# Patient Record
Sex: Female | Born: 1990 | Race: White | Hispanic: No | Marital: Single | Smoking: Never smoker
Health system: Southern US, Community
[De-identification: ages and names within clinical notes are randomized; demographics above are authoritative.]

## PROBLEM LIST (undated history)

## (undated) DIAGNOSIS — F909 Attention-deficit hyperactivity disorder, unspecified type: Secondary | ICD-10-CM

## (undated) DIAGNOSIS — F32A Depression, unspecified: Secondary | ICD-10-CM

## (undated) DIAGNOSIS — F419 Anxiety disorder, unspecified: Secondary | ICD-10-CM

## (undated) DIAGNOSIS — R109 Unspecified abdominal pain: Secondary | ICD-10-CM

## (undated) DIAGNOSIS — N390 Urinary tract infection, site not specified: Secondary | ICD-10-CM

## (undated) DIAGNOSIS — F319 Bipolar disorder, unspecified: Secondary | ICD-10-CM

## (undated) DIAGNOSIS — F99 Mental disorder, not otherwise specified: Secondary | ICD-10-CM

## (undated) DIAGNOSIS — G43909 Migraine, unspecified, not intractable, without status migrainosus: Secondary | ICD-10-CM

## (undated) HISTORY — DX: Anxiety disorder, unspecified: F41.9

## (undated) HISTORY — DX: Urinary tract infection, site not specified: N39.0

## (undated) HISTORY — DX: Unspecified abdominal pain: R10.9

## (undated) HISTORY — DX: Depression, unspecified: F32.A

## (undated) HISTORY — DX: Bipolar disorder, unspecified: F31.9

## (undated) HISTORY — PX: TONSILLECTOMY: SUR1361

## (undated) HISTORY — DX: Mental disorder, not otherwise specified: F99

## (undated) HISTORY — DX: Attention-deficit hyperactivity disorder, unspecified type: F90.9

## (undated) HISTORY — DX: Migraine, unspecified, not intractable, without status migrainosus: G43.909

---
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
## 2018-02-13 DIAGNOSIS — F3170 Bipolar disorder, currently in remission, most recent episode unspecified: Secondary | ICD-10-CM | POA: Insufficient documentation

## 2018-02-13 DIAGNOSIS — F419 Anxiety disorder, unspecified: Secondary | ICD-10-CM | POA: Insufficient documentation

## 2018-02-13 DIAGNOSIS — F411 Generalized anxiety disorder: Secondary | ICD-10-CM | POA: Insufficient documentation

## 2019-01-21 ENCOUNTER — Encounter: Payer: Self-pay | Admitting: Radiology

## 2019-01-24 ENCOUNTER — Encounter: Payer: Self-pay | Admitting: Obstetrics and Gynecology

## 2019-01-29 ENCOUNTER — Other Ambulatory Visit (HOSPITAL_COMMUNITY)
Admission: RE | Admit: 2019-01-29 | Discharge: 2019-01-29 | Disposition: A | Discharge status: Home / Self Care | Payer: BLUE CROSS/BLUE SHIELD | Source: Ambulatory Visit | Attending: Obstetrics and Gynecology | Admitting: Obstetrics and Gynecology

## 2019-01-29 ENCOUNTER — Ambulatory Visit (INDEPENDENT_AMBULATORY_CARE_PROVIDER_SITE_OTHER): Payer: BLUE CROSS/BLUE SHIELD | Admitting: Obstetrics & Gynecology

## 2019-01-29 ENCOUNTER — Encounter: Payer: Self-pay | Admitting: Obstetrics & Gynecology

## 2019-01-29 VITALS — BP 114/81 | HR 80 | Ht 61.0 in | Wt 181.0 lb

## 2019-01-29 DIAGNOSIS — Z01419 Encounter for gynecological examination (general) (routine) without abnormal findings: Secondary | ICD-10-CM | POA: Insufficient documentation

## 2019-01-29 DIAGNOSIS — Z3202 Encounter for pregnancy test, result negative: Secondary | ICD-10-CM | POA: Diagnosis not present

## 2019-01-29 DIAGNOSIS — Z30431 Encounter for routine checking of intrauterine contraceptive device: Secondary | ICD-10-CM

## 2019-01-29 LAB — POCT URINE PREGNANCY: Preg Test, Ur: NEGATIVE

## 2019-01-29 NOTE — Patient Instructions (Signed)
Preventive Care 18-39 Years, Female Preventive care refers to lifestyle choices and visits with your health care provider that can promote health and wellness. What does preventive care include?   A yearly physical exam. This is also called an annual well check.  Dental exams once or twice a year.  Routine eye exams. Ask your health care provider how often you should have your eyes checked.  Personal lifestyle choices, including: ? Daily care of your teeth and gums. ? Regular physical activity. ? Eating a healthy diet. ? Avoiding tobacco and drug use. ? Limiting alcohol use. ? Practicing safe sex. ? Taking vitamin and mineral supplements as recommended by your health care provider. What happens during an annual well check? The services and screenings done by your health care provider during your annual well check will depend on your age, overall health, lifestyle risk factors, and family history of disease. Counseling Your health care provider may ask you questions about your:  Alcohol use.  Tobacco use.  Drug use.  Emotional well-being.  Home and relationship well-being.  Sexual activity.  Eating habits.  Work and work environment.  Method of birth control.  Menstrual cycle.  Pregnancy history. Screening You may have the following tests or measurements:  Height, weight, and BMI.  Diabetes screening. This is done by checking your blood sugar (glucose) after you have not eaten for a while (fasting).  Blood pressure.  Lipid and cholesterol levels. These may be checked every 5 years starting at age 20.  Skin check.  Hepatitis C blood test.  Hepatitis B blood test.  Sexually transmitted disease (STD) testing.  BRCA-related cancer screening. This may be done if you have a family history of breast, ovarian, tubal, or peritoneal cancers.  Pelvic exam and Pap test. This may be done every 3 years starting at age 21. Starting at age 30, this may be done every 5  years if you have a Pap test in combination with an HPV test. Discuss your test results, treatment options, and if necessary, the need for more tests with your health care provider. Vaccines Your health care provider may recommend certain vaccines, such as:  Influenza vaccine. This is recommended every year.  Tetanus, diphtheria, and acellular pertussis (Tdap, Td) vaccine. You may need a Td booster every 10 years.  Varicella vaccine. You may need this if you have not been vaccinated.  HPV vaccine. If you are 26 or younger, you may need three doses over 6 months.  Measles, mumps, and rubella (MMR) vaccine. You may need at least one dose of MMR. You may also need a second dose.  Pneumococcal 13-valent conjugate (PCV13) vaccine. You may need this if you have certain conditions and were not previously vaccinated.  Pneumococcal polysaccharide (PPSV23) vaccine. You may need one or two doses if you smoke cigarettes or if you have certain conditions.  Meningococcal vaccine. One dose is recommended if you are age 19-21 years and a first-year college student living in a residence hall, or if you have one of several medical conditions. You may also need additional booster doses.  Hepatitis A vaccine. You may need this if you have certain conditions or if you travel or work in places where you may be exposed to hepatitis A.  Hepatitis B vaccine. You may need this if you have certain conditions or if you travel or work in places where you may be exposed to hepatitis B.  Haemophilus influenzae type b (Hib) vaccine. You may need this if you   have certain risk factors. Talk to your health care provider about which screenings and vaccines you need and how often you need them. This information is not intended to replace advice given to you by your health care provider. Make sure you discuss any questions you have with your health care provider. Document Released: 01/10/2002 Document Revised: 06/27/2017  Document Reviewed: 09/15/2015 Elsevier Interactive Patient Education  2019 [employer].

## 2019-01-29 NOTE — Progress Notes (Signed)
GYNECOLOGY ANNUAL PREVENTATIVE CARE ENCOUNTER NOTE  History:     Veronica Burgess is a 28 y.o. [id number] female here to establish care and for a routine annual gynecologic exam.  Current complaints: none.  On Topamax, wants to discuss interaction with Mirena.   Denies abnormal vaginal bleeding, discharge, pelvic pain, problems with intercourse or other gynecologic concerns.    Gynecologic History No LMP recorded. (Menstrual status: IUD). Contraception: Mirena IUD placed 02/01/2017 Last Pap: 2018. Results were: normal   Obstetric History OB History  Gravida Para Term Preterm AB Living  1 1 1     1   SAB TAB Ectopic Multiple Live Births          1    # Outcome Date GA Lbr Len/2nd Weight Sex Delivery Anes PTL Lv  1 Term 10/28/13     Vag-Spont EPI  LIV    Past Medical History:  Diagnosis Date  . Mental disorder    Bipolar    Past Surgical History:  Procedure Laterality Date  . TONSILLECTOMY  2005    Current Outpatient Medications on File Prior to Visit  Medication Sig Dispense Refill  . ARIPiprazole (ABILIFY) 2 MG tablet Take 1/2 tablet daily for one week then take 1 tablet daily    . buPROPion (WELLBUTRIN XL) 150 MG 24 hr tablet Take 150 mg by mouth daily.    Marland Kitchen buPROPion (WELLBUTRIN XL) 300 MG 24 hr tablet Take 300 mg by mouth daily.    Marland Kitchen levonorgestrel (MIRENA) 20 MCG/24HR IUD 1 each by Intrauterine route once.    . topiramate (TOPAMAX) 100 MG tablet Take 75 mg by mouth 2 (two) times daily.    . clonazePAM (KLONOPIN) 0.5 MG tablet Take 0.5 mg by mouth 2 (two) times daily as needed.    Marland Kitchen LATUDA 20 MG TABS tablet Take 20 mg by mouth at bedtime.     No current facility-administered medications on file prior to visit.     No Known Allergies  Social History:  reports that she has never smoked. She has never used smokeless tobacco. She reports current alcohol use. She reports previous drug use.  History reviewed. No pertinent family history.  The following portions of the  patient's history were reviewed and updated as appropriate: allergies, current medications, past family history, past medical history, past social history, past surgical history and problem list.  Review of Systems Pertinent items noted in HPI and remainder of comprehensive ROS otherwise negative.  Physical Exam:  BP 114/81   Pulse 80   Ht 5\' 1"  (1.549 m)   Wt 181 lb (82.1 kg)   BMI 34.20 kg/m  CONSTITUTIONAL: Well-developed, well-nourished female in no acute distress.  HENT:  Normocephalic, atraumatic, External right and left ear normal. Oropharynx is clear and moist EYES: Conjunctivae and EOM are normal. Pupils are equal, round, and reactive to light. No scleral icterus.  NECK: Normal range of motion, supple, no masses.  Normal thyroid.  SKIN: Skin is warm and dry. No rash noted. Not diaphoretic. No erythema. No pallor. MUSCULOSKELETAL: Normal range of motion. No tenderness.  No cyanosis, clubbing, or edema.  2+ distal pulses. NEUROLOGIC: Alert and oriented to person, place, and time. Normal reflexes, muscle tone coordination. No cranial nerve deficit noted. PSYCHIATRIC: Normal mood and affect. Normal behavior. Normal judgment and thought content. CARDIOVASCULAR: Normal heart rate noted, regular rhythm RESPIRATORY: Clear to auscultation bilaterally. Effort and breath sounds normal, no problems with respiration noted. BREASTS: Symmetric in size. No masses, skin  changes, nipple drainage, or lymphadenopathy. ABDOMEN: Soft, normal bowel sounds, no distention noted.  No tenderness, rebound or guarding.  PELVIC: Normal appearing external genitalia; normal appearing vaginal mucosa and cervix. IUD strings visualized.  No abnormal discharge noted.  Pap smear obtained.  Normal uterine size, no other palpable masses, no uterine or adnexal tenderness.   Assessment and Plan:    1. IUD check up Talked about the effect of Topamax and conern about decreased progestin efficacy. Reiterated that IUD has  other mechanisms of action (creating hostile uterine environment, cervical mucus etc); and it is still the most effective contraception modality no matter the dose of Topamax.  Reiterated that no contraception is 100% effective though but IUDs are >99% effective.  Discussed switching to Paragard, she does not want this as the Mirena is also being used for its menstrual suppression effect as patient had heavy and painful periods in the past.  - POCT urine pregnancy  2. Well woman exam with routine gynecological exam - Cytology - PAP Will follow up results of pap smear and manage accordingly. Routine preventative health maintenance measures emphasized. Please refer to After Visit Summary for other counseling recommendations.      Jaynie Collins, MD, FACOG Obstetrician & Gynecologist, [facility] for [employer], [facility]

## 2019-01-30 LAB — CYTOLOGY - PAP: Diagnosis: NEGATIVE

## 2019-07-10 ENCOUNTER — Other Ambulatory Visit: Payer: Self-pay

## 2019-07-10 DIAGNOSIS — Z20822 Contact with and (suspected) exposure to covid-19: Secondary | ICD-10-CM

## 2019-07-11 LAB — NOVEL CORONAVIRUS, NAA: SARS-CoV-2, NAA: NOT DETECTED

## 2019-08-26 ENCOUNTER — Other Ambulatory Visit: Payer: Self-pay | Admitting: *Deleted

## 2019-08-26 DIAGNOSIS — Z20822 Contact with and (suspected) exposure to covid-19: Secondary | ICD-10-CM

## 2019-08-27 LAB — NOVEL CORONAVIRUS, NAA: SARS-CoV-2, NAA: NOT DETECTED

## 2019-09-23 ENCOUNTER — Other Ambulatory Visit: Payer: Self-pay

## 2019-09-23 DIAGNOSIS — Z20822 Contact with and (suspected) exposure to covid-19: Secondary | ICD-10-CM

## 2019-09-24 LAB — NOVEL CORONAVIRUS, NAA: SARS-CoV-2, NAA: NOT DETECTED

## 2019-10-01 ENCOUNTER — Other Ambulatory Visit: Payer: Self-pay

## 2019-10-01 DIAGNOSIS — Z20822 Contact with and (suspected) exposure to covid-19: Secondary | ICD-10-CM

## 2019-10-02 LAB — NOVEL CORONAVIRUS, NAA: SARS-CoV-2, NAA: NOT DETECTED

## 2019-12-19 ENCOUNTER — Encounter: Payer: Self-pay | Admitting: Radiology

## 2019-12-24 ENCOUNTER — Ambulatory Visit: Payer: Medicaid Other | Attending: Internal Medicine

## 2019-12-24 DIAGNOSIS — Z20822 Contact with and (suspected) exposure to covid-19: Secondary | ICD-10-CM

## 2019-12-25 LAB — NOVEL CORONAVIRUS, NAA: SARS-CoV-2, NAA: NOT DETECTED

## 2020-01-21 ENCOUNTER — Ambulatory Visit: Payer: BLUE CROSS/BLUE SHIELD | Admitting: Internal Medicine

## 2020-01-21 ENCOUNTER — Ambulatory Visit: Payer: Medicaid Other | Admitting: Primary Care

## 2020-01-28 ENCOUNTER — Ambulatory Visit (HOSPITAL_COMMUNITY)
Admission: EM | Admit: 2020-01-28 | Discharge: 2020-01-28 | Disposition: A | Discharge status: Home / Self Care | Payer: 59 | Attending: Family Medicine | Admitting: Family Medicine

## 2020-01-28 ENCOUNTER — Other Ambulatory Visit: Payer: Medicaid Other

## 2020-01-28 ENCOUNTER — Encounter (INDEPENDENT_AMBULATORY_CARE_PROVIDER_SITE_OTHER): Payer: Self-pay

## 2020-01-28 ENCOUNTER — Other Ambulatory Visit: Payer: Self-pay

## 2020-01-28 ENCOUNTER — Encounter (HOSPITAL_COMMUNITY): Payer: Self-pay

## 2020-01-28 DIAGNOSIS — Z79899 Other long term (current) drug therapy: Secondary | ICD-10-CM | POA: Diagnosis not present

## 2020-01-28 DIAGNOSIS — Z20822 Contact with and (suspected) exposure to covid-19: Secondary | ICD-10-CM | POA: Insufficient documentation

## 2020-01-28 DIAGNOSIS — F319 Bipolar disorder, unspecified: Secondary | ICD-10-CM | POA: Diagnosis not present

## 2020-01-28 DIAGNOSIS — R5383 Other fatigue: Secondary | ICD-10-CM | POA: Insufficient documentation

## 2020-01-28 LAB — TSH: TSH: 2.273 u[IU]/mL (ref 0.350–4.500)

## 2020-01-28 LAB — CBC
HCT: 41.8 % (ref 36.0–46.0)
Hemoglobin: 14.1 g/dL (ref 12.0–15.0)
MCH: 30.1 pg (ref 26.0–34.0)
MCHC: 33.7 g/dL (ref 30.0–36.0)
MCV: 89.3 fL (ref 80.0–100.0)
Platelets: 233 10*3/uL (ref 150–400)
RBC: 4.68 MIL/uL (ref 3.87–5.11)
RDW: 12.7 % (ref 11.5–15.5)
WBC: 6.4 10*3/uL (ref 4.0–10.5)
nRBC: 0 % (ref 0.0–0.2)

## 2020-01-28 NOTE — ED Triage Notes (Signed)
Pt presents with fatigue and generalized body aches X 2 days.

## 2020-01-28 NOTE — ED Provider Notes (Signed)
[facility]    CSN: [id number] Arrival date & time: 01/28/20  1228      History   Chief Complaint Chief Complaint  Patient presents with  . Appointment  . (12:30 pm Fatigue & Generalized Body Aches)    HPI Veronica Burgess is a 29 y.o. female.   HPI  Patient states that she has had fatigue for about a week.  She has had some generalized body aches for the last 2 days.  She states that she thought the fatigue might be just because she was working, single mother, and in school.  She does have an appointment with her primary care doctor for next week.  She works in a daycare.  She is here because with the body aches she feels like she needs Covid testing.  No sweats chills or fever.  No runny stuffy nose, sore throat, coughing or chest congestion.  No shortness of breath.  No nausea or vomiting, no change in taste or smell.  No known Covid exposure.  She states her throat feels dry. Patient states she had mono many years ago.  This feels similar.  She wonders whether mono can be chronic or recurring.  I told her it would be unusual for mono to come back after a many year absence. I discussed with patient that fatigue can be from stress as she was describing, could also be thyroid, anemia, or acute illness such as a virus.  Past Medical History:  Diagnosis Date  . Mental disorder    Bipolar    There are no problems to display for this patient.   Past Surgical History:  Procedure Laterality Date  . TONSILLECTOMY  2005    OB History    Gravida  1   Para  1   Term  1   Preterm      AB      Living  1     SAB      TAB      Ectopic      Multiple      Live Births  1            Home Medications    Prior to Admission medications   Medication Sig Start Date End Date Taking? Authorizing Provider  ARIPiprazole (ABILIFY) 2 MG tablet Take 1/2 tablet daily for one week then take 1 tablet daily 01/28/19   [provider]  buPROPion (WELLBUTRIN XL)  150 MG 24 hr tablet Take 150 mg by mouth daily.    [provider]  buPROPion (WELLBUTRIN XL) 300 MG 24 hr tablet Take 300 mg by mouth daily.    [provider]  clonazePAM (KLONOPIN) 0.5 MG tablet Take 0.5 mg by mouth 2 (two) times daily as needed. 12/30/18   [provider]  LATUDA 20 MG TABS tablet Take 20 mg by mouth at bedtime. 08/17/18   [provider]  levonorgestrel (MIRENA) 20 MCG/24HR IUD 1 each by Intrauterine route once. 02/01/17   [provider]  topiramate (TOPAMAX) 100 MG tablet Take 75 mg by mouth 2 (two) times daily.    [provider]    Family History History reviewed. No pertinent family history.  Social History Social History   Tobacco Use  . Smoking status: Never Smoker  . Smokeless tobacco: Never Used  Substance Use Topics  . Alcohol use: Yes    Comment: social   . Drug use: Not Currently     Allergies   Patient  has no known allergies.   Review of Systems Review of Systems  Constitutional: Positive for fatigue. Negative for chills and fever.  HENT: Negative for congestion, hearing loss, sore throat and trouble swallowing.   Respiratory: Negative for cough and shortness of breath.   Gastrointestinal: Negative for nausea and vomiting.  Musculoskeletal: Positive for myalgias.  Neurological: Positive for headaches.       "I often have headaches     Physical Exam Triage Vital Signs ED Triage Vitals [01/28/20 1257]  Enc Vitals Group     BP 130/72     Pulse Rate 94     Resp 18     Temp 98.4 F (36.9 C)     Temp Source Oral     SpO2 100 %     Weight      Height      Head Circumference      Peak Flow      Pain Score 5     Pain Loc      Pain Edu?      Excl. in GC?    No data found.  Updated Vital Signs BP 130/72 (BP Location: Right Arm)   Pulse 94   Temp 98.4 F (36.9 C) (Oral)   Resp 18   SpO2 100%   Visual Acuity Right Eye Distance:   Left Eye Distance:   Bilateral Distance:     Right Eye Near:   Left Eye Near:    Bilateral Near:     Physical Exam Constitutional:      General: She is not in acute distress.    Appearance: Normal appearance. She is well-developed. She is not ill-appearing.  HENT:     Head: Normocephalic and atraumatic.     Nose: Nose normal. No congestion.     Mouth/Throat:     Mouth: Mucous membranes are moist.     Pharynx: Oropharynx is clear. No posterior oropharyngeal erythema.  Eyes:     Conjunctiva/sclera: Conjunctivae normal.     Pupils: Pupils are equal, round, and reactive to light.  Cardiovascular:     Rate and Rhythm: Normal rate and regular rhythm.     Heart sounds: Normal heart sounds.  Pulmonary:     Effort: Pulmonary effort is normal. No respiratory distress.     Breath sounds: Normal breath sounds.  Musculoskeletal:        General: Normal range of motion.     Cervical back: Normal range of motion.  Lymphadenopathy:     Cervical: No cervical adenopathy.  Skin:    General: Skin is warm and dry.     Comments: Normal-appearing skin and hair  Neurological:     General: No focal deficit present.     Mental Status: She is alert.  Psychiatric:        Mood and Affect: Mood normal.        Behavior: Behavior normal.      UC Treatments / Results  Labs (all labs ordered are listed, but only abnormal results are displayed) Labs Reviewed  NOVEL CORONAVIRUS, NAA (HOSP ORDER, SEND-OUT TO REF LAB; TAT 18-24 HRS)  CBC  TSH    EKG   Radiology No results found.  Procedures Procedures (including critical care time)  Medications Ordered in UC Medications - No data to display  Initial Impression / Assessment and Plan / UC Course  I have reviewed the triage vital signs and the nursing notes.  Pertinent labs & imaging results that were available during my  care of the patient were reviewed by me and considered in my medical decision making (see chart for details).     See HPI.  We will do a CBC and TSH.  Covid  testing.  Patient will rest and push fluids.  See her PCP on 02/03/2020 as scheduled Final Clinical Impressions(s) / UC Diagnoses   Final diagnoses:  Fatigue, unspecified type     Discharge Instructions     Go home to rest Drink plenty of fluids Take Tylenol for pain or fever You may take over-the-counter cough and cold medicines as needed You must quarantine at home until your test result is available You can check for your test result in MyChart We will call with lab blood testing    ED Prescriptions    None     PDMP not reviewed this encounter.   Eustace Moore, MD 01/28/20 [phone]

## 2020-01-28 NOTE — Discharge Instructions (Signed)
Go home to rest Drink plenty of fluids Take Tylenol for pain or fever You may take over-the-counter cough and cold medicines as needed You must quarantine at home until your test result is available You can check for your test result in MyChart We will call with lab blood testing

## 2020-01-28 NOTE — Progress Notes (Signed)
Results are normal.

## 2020-01-30 LAB — NOVEL CORONAVIRUS, NAA (HOSP ORDER, SEND-OUT TO REF LAB; TAT 18-24 HRS): SARS-CoV-2, NAA: NOT DETECTED

## 2020-02-03 ENCOUNTER — Encounter: Payer: Self-pay | Admitting: Primary Care

## 2020-02-03 ENCOUNTER — Other Ambulatory Visit: Payer: Self-pay

## 2020-02-03 ENCOUNTER — Ambulatory Visit (INDEPENDENT_AMBULATORY_CARE_PROVIDER_SITE_OTHER): Payer: 59 | Admitting: Primary Care

## 2020-02-03 VITALS — BP 106/72 | HR 80 | Temp 97.1°F | Ht 61.25 in | Wt 184.1 lb

## 2020-02-03 DIAGNOSIS — R109 Unspecified abdominal pain: Secondary | ICD-10-CM | POA: Diagnosis not present

## 2020-02-03 DIAGNOSIS — F319 Bipolar disorder, unspecified: Secondary | ICD-10-CM | POA: Diagnosis not present

## 2020-02-03 DIAGNOSIS — G43909 Migraine, unspecified, not intractable, without status migrainosus: Secondary | ICD-10-CM | POA: Insufficient documentation

## 2020-02-03 DIAGNOSIS — G43709 Chronic migraine without aura, not intractable, without status migrainosus: Secondary | ICD-10-CM | POA: Diagnosis not present

## 2020-02-03 DIAGNOSIS — K625 Hemorrhage of anus and rectum: Secondary | ICD-10-CM | POA: Diagnosis not present

## 2020-02-03 DIAGNOSIS — F3181 Bipolar II disorder: Secondary | ICD-10-CM | POA: Insufficient documentation

## 2020-02-03 DIAGNOSIS — G8929 Other chronic pain: Secondary | ICD-10-CM | POA: Diagnosis not present

## 2020-02-03 DIAGNOSIS — M79641 Pain in right hand: Secondary | ICD-10-CM | POA: Insufficient documentation

## 2020-02-03 DIAGNOSIS — M79642 Pain in left hand: Secondary | ICD-10-CM

## 2020-02-03 LAB — COMPREHENSIVE METABOLIC PANEL
ALT: 24 U/L (ref 0–35)
AST: 14 U/L (ref 0–37)
Albumin: 4.2 g/dL (ref 3.5–5.2)
Alkaline Phosphatase: 57 U/L (ref 39–117)
BUN: 9 mg/dL (ref 6–23)
CO2: 27 mEq/L (ref 19–32)
Calcium: 9.2 mg/dL (ref 8.4–10.5)
Chloride: 106 mEq/L (ref 96–112)
Creatinine, Ser: 0.73 mg/dL (ref 0.40–1.20)
GFR: 93.66 mL/min (ref >60.00)
Glucose, Bld: 87 mg/dL (ref 70–99)
Potassium: 3.8 mEq/L (ref 3.5–5.1)
Sodium: 140 mEq/L (ref 135–145)
Total Bilirubin: 0.5 mg/dL (ref 0.2–1.2)
Total Protein: 6.8 g/dL (ref 6.0–8.3)

## 2020-02-03 LAB — CBC
HCT: 39.6 % (ref 36.0–46.0)
Hemoglobin: 13.6 g/dL (ref 12.0–15.0)
MCHC: 34.3 g/dL (ref 30.0–36.0)
MCV: 89.9 fl (ref 78.0–100.0)
Platelets: 208 10*3/uL (ref 150.0–400.0)
RBC: 4.41 Mil/uL (ref 3.87–5.11)
RDW: 13.4 % (ref 11.5–15.5)
WBC: 6.1 10*3/uL (ref 4.0–10.5)

## 2020-02-03 NOTE — Assessment & Plan Note (Signed)
Chronic for years, occurs with eating mostly.  Check labs today including CBC, CMP, Celiac panel. Referral placed to GI for evaluation.

## 2020-02-03 NOTE — Progress Notes (Signed)
Subjective:    Patient ID: Veronica Burgess, female    DOB: Mar 09, 1991, 29 y.o.   MRN: [id number]  HPI  This visit occurred during the SARS-CoV-2 public health emergency.  Safety protocols were in place, including screening questions prior to the visit, additional usage of staff PPE, and extensive cleaning of exam room while observing appropriate contact time as indicated for disinfecting solutions.   Veronica Burgess is a 29 year old female who presents today to establish care and discuss the problems mentioned below. Will obtain/review records.  1) Bipolar Disorder: Diagnosed several years ago. Currently managed on bupropion XL 300 mg and 150 mg, Topiramate 100 mg BID. Previously following with psychiatry through [facility], last visit in October 2020. She has a prescription for clonazepam for which she has never taken.  She is needing a local psychiatrist as her prior one is out of network.   2) Abdominal Pain/Rectal Bleeding: Abdominal pain chronic and intermittent occurring only when she eats dairy and certain foods. She was taking lactate pills OTC with improvement but now not finding any relief. Over the last 2-3 months she's had two episodes of bright red rectal bleeding without constipation, one episode with diarrhea.   Symptoms include generalized abdominal pain occurring when eating certain foods like pasta sauce, dairy foods, some vegetables, Ramen noodles, Reeces Peanut Butter cups.   She underwent GI evaluation around the age of 69, underwent endoscopy at the time which was normal, diagnosed with IBS. She's never had a colonoscopy. She does take OTC omeprazole.   She does intermittent fasting and feels much better when she doesn't eat.  3) Hand Pain: Chronic for the last year to bilateral hands from fingers to mid forearm. She denies numbness/tinging. 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 She notices pain mostly when stirring with a spoon and opening jars. She's not tried  anything OTC, not wearing braces.   4) Migraines: Chronic for years, experiences migraines once weekly on average. Located to several places, mostly unilaterally.  She also experiences nausea, photophobia, phonophobia. She will typically take Excedrin migraine and sleep with improvement. She drinks a lot of caffeine throughout the day. She is managed on topiramate 100 mg BID for Bipolar disorder.   Review of Systems  Gastrointestinal: Positive for abdominal pain and nausea. Negative for constipation and vomiting.  Musculoskeletal: Positive for arthralgias.       Chronic bilateral hand pain  Neurological: Positive for headaches. Negative for numbness.  Psychiatric/Behavioral:       See HPI       Past Medical History:  Diagnosis Date  . Abdominal pain   . Bipolar disorder (HCC)    Bipolar  . Migraines   . UTI (urinary tract infection)      Social History   Socioeconomic History  . Marital status: Single    Spouse name: Not on file  . Number of children: Not on file  . Years of education: Not on file  . Highest education level: Not on file  Occupational History  . Not on file  Tobacco Use  . Smoking status: Never Smoker  . Smokeless tobacco: Never Used  Substance and Sexual Activity  . Alcohol use: Yes    Comment: social   . Drug use: Not Currently  . Sexual activity: Yes    Birth control/protection: I.U.D.  Other Topics Concern  . Not on file  Social History Narrative  . Not on file   Social Determinants of Health  Financial Resource Strain:   . Difficulty of Paying Living Expenses: Not on file  Food Insecurity:   . Worried About Charity fundraiser in the Last Year: Not on file  . Ran Out of Food in the Last Year: Not on file  Transportation Needs:   . Lack of Transportation (Medical): Not on file  . Lack of Transportation (Non-Medical): Not on file  Physical Activity:   . Days of Exercise per Week: Not on file  . Minutes of Exercise per Session: Not on file   Stress:   . Feeling of Stress : Not on file  Social Connections:   . Frequency of Communication with Friends and Family: Not on file  . Frequency of Social Gatherings with Friends and Family: Not on file  . Attends Religious Services: Not on file  . Active Member of Clubs or Organizations: Not on file  . Attends Archivist Meetings: Not on file  . Marital Status: Not on file  Intimate Partner Violence:   . Fear of Current or Ex-Partner: Not on file  . Emotionally Abused: Not on file  . Physically Abused: Not on file  . Sexually Abused: Not on file    Past Surgical History:  Procedure Laterality Date  . TONSILLECTOMY  2005    Family History  Problem Relation Age of Onset  . Depression Mother   . Hyperlipidemia Mother   . Depression Father   . Hypertension Father   . ADD / ADHD Father   . Depression Brother   . ADD / ADHD Brother   . Depression Maternal Grandmother   . Diabetes Maternal Grandmother   . Hyperlipidemia Maternal Grandmother   . Hypertension Maternal Grandmother   . Alcohol abuse Maternal Grandfather   . COPD Maternal Grandfather   . Depression Maternal Grandfather   . Depression Paternal Grandmother   . Schizophrenia Paternal Grandmother   . Drug abuse Paternal Grandmother   . Hypertension Paternal Grandmother     No Known Allergies  Current Outpatient Medications on File Prior to Visit  Medication Sig Dispense Refill  . buPROPion (WELLBUTRIN XL) 150 MG 24 hr tablet Take 150 mg by mouth daily.    Marland Kitchen buPROPion (WELLBUTRIN XL) 300 MG 24 hr tablet Take 300 mg by mouth daily.    Marland Kitchen levonorgestrel (MIRENA) 20 MCG/24HR IUD 1 each by Intrauterine route once.    . topiramate (TOPAMAX) 100 MG tablet Take 100 mg by mouth 2 (two) times daily.     . clonazePAM (KLONOPIN) 0.5 MG tablet Take 0.5 mg by mouth 2 (two) times daily as needed.     No current facility-administered medications on file prior to visit.    BP 106/72   Pulse 80   Temp (!) 97.1 F  (36.2 C) (Temporal)   Ht 5' 1.25" (1.556 m)   Wt 184 lb 1.9 oz (83.5 kg)   SpO2 98%   BMI 34.51 kg/m    Objective:   Physical Exam  Constitutional: She appears well-nourished.  Cardiovascular: Normal rate and regular rhythm.  Respiratory: Effort normal and breath sounds normal.  Musculoskeletal:     Cervical back: Neck supple.  Skin: Skin is warm and dry.  Psychiatric: She has a normal mood and affect.           Assessment & Plan:

## 2020-02-03 NOTE — Assessment & Plan Note (Signed)
Chronic for one year, bilateral hand pain with movement. Consider rheumatology work up, did not have time this visit to go over in great detail.  She will schedule another visit to discuss. Discussed to try wrist braces first, could be carpal tunnel.

## 2020-02-03 NOTE — Assessment & Plan Note (Signed)
Chronic, overall feels well managed. Referral placed to psychiatry for evaluation and management. She does not use benzos.

## 2020-02-03 NOTE — Assessment & Plan Note (Signed)
Acute and two isolated episodes, one with diarrhea. Referral placed to GI given chronic abdominal pain with new rectal bleeding with diarrhea.   May need colonoscopy to rule out UC or Crohn's.

## 2020-02-03 NOTE — Assessment & Plan Note (Signed)
Chronic for years. Didn't have much time today to discuss. She is managed on Topiramate 100 mg BID (for bipolar disorder) and continues to have weekly migraines.  Consider other treatment such as propranolol but will defer for now as she will be establishing with psychiatry. Would avoid amitriptyline for now.

## 2020-02-03 NOTE — Patient Instructions (Signed)
Stop by the lab prior to leaving today. I will notify you of your results once received.   You will be contacted regarding your referral to psychiatry and GI.  Please let us know if you have not been contacted within two weeks.   It was a pleasure to see you today!

## 2020-02-08 ENCOUNTER — Ambulatory Visit (HOSPITAL_COMMUNITY): Payer: Medicaid Other | Admitting: Psychiatry

## 2020-02-10 LAB — CELIAC PNL 2 RFLX ENDOMYSIAL AB TTR
(tTG) Ab, IgA: <1 U/mL
(tTG) Ab, IgG: 1 U/mL
Endomysial Ab IgA: NEGATIVE
Gliadin IgA: 3 U (ref <20)
Gliadin IgG: 2 U (ref <20)
Immunoglobulin A: 125 mg/dL (ref 47–310)

## 2020-02-20 ENCOUNTER — Ambulatory Visit: Payer: 59 | Attending: Internal Medicine

## 2020-02-20 DIAGNOSIS — Z23 Encounter for immunization: Secondary | ICD-10-CM

## 2020-02-20 NOTE — Progress Notes (Signed)
   Covid-19 Vaccination Clinic  Name:  Veronica Burgess    MRN: [id number] DOB: 09-15-1991  02/20/2020  [name] was observed post Covid-19 immunization for 15 minutes without incident. She was provided with Vaccine Information Sheet and instruction to access the V-Safe system.   [name] was instructed to call 911 with any severe reactions post vaccine: Marland Kitchen Difficulty breathing  . Swelling of face and throat  . A fast heartbeat  . A bad rash all over body  . Dizziness and weakness   Immunizations Administered    Name Date Dose VIS Date Route   Pfizer COVID-19 Vaccine 02/20/2020 12:12 PM 0.3 mL 11/08/2019 Intramuscular   Manufacturer: [employer], [employer]   Lot: [id number]   NDC: [address]-3

## 2020-03-16 ENCOUNTER — Ambulatory Visit: Payer: 59 | Attending: Internal Medicine

## 2020-03-16 DIAGNOSIS — Z23 Encounter for immunization: Secondary | ICD-10-CM

## 2020-03-16 NOTE — Progress Notes (Signed)
   Covid-19 Vaccination Clinic  Name:  Sabrea Sankey    MRN: [id number] DOB: 1991/04/08  03/16/2020  Ms. Hurtado was observed post Covid-19 immunization for 15 minutes without incident. She was provided with Vaccine Information Sheet and instruction to access the V-Safe system.   Ms. Fuhrer was instructed to call 911 with any severe reactions post vaccine: Marland Kitchen Difficulty breathing  . Swelling of face and throat  . A fast heartbeat  . A bad rash all over body  . Dizziness and weakness   Immunizations Administered    Name Date Dose VIS Date Route   Pfizer COVID-19 Vaccine 03/16/2020  1:12 PM 0.3 mL 01/22/2019 Intramuscular   Manufacturer: [employer], [employer]   Lot: [id number]   NDC: [address]-0

## 2020-03-30 ENCOUNTER — Telehealth (INDEPENDENT_AMBULATORY_CARE_PROVIDER_SITE_OTHER): Payer: 59 | Admitting: Gastroenterology

## 2020-03-30 ENCOUNTER — Encounter: Payer: Self-pay | Admitting: Gastroenterology

## 2020-03-30 DIAGNOSIS — R1013 Epigastric pain: Secondary | ICD-10-CM

## 2020-03-30 DIAGNOSIS — K580 Irritable bowel syndrome with diarrhea: Secondary | ICD-10-CM | POA: Diagnosis not present

## 2020-03-30 NOTE — Progress Notes (Signed)
Veronica Donath, MD [address]  [address]  [place], [place] [address]  Main: [phone]  Fax: [phone]    Gastroenterology Consultation Video Visit  Referring Provider:     Doreene Nest, NP Primary Care Physician:  Veronica Nest, NP Primary Gastroenterologist:  Dr. Arlyss Burgess Reason for Consultation:   Abdominal pain, rectal bleeding        HPI:   Veronica Burgess is a 29 y.o. female referred by Veronica Burgess, Veronica Scrape, NP  for consultation & management of abdominal pain and rectal bleeding  Virtual Visit Video Note  I connected with Veronica Burgess on 03/30/20 at  3:15 PM EDT by video and verified that I am speaking with the correct person using two identifiers.   I discussed the limitations, risks, security and privacy concerns of performing an evaluation and management service by video and the availability of in person appointments. I also discussed with the patient that there may be a patient responsible charge related to this service. The patient expressed understanding and agreed to proceed.  Location of the Patient: Home  Location of the provider: Office  Persons participating in the visit: Patient and provider only   History of Present Illness:  Veronica Burgess is a 29 year old female with history of bipolar is seen in consultation for 1 year history of worsening of abdominal pain, cramping, generalized associated with bloating and nonbloody diarrhea.  She had 1 or 2 episodes of rectal bleeding on wiping only.  Patient reports that she is not able to tolerate any kind of food in last few months that results in grams and urgency.  She is also concerned that she has been gaining weight. She acknowledges that she is in a lot of stress, single mom, working as well as going to school  Labs are unremarkable, celiac serologies are normal She does not smoke or drink alcohol  NSAIDs: None  Antiplts/Anticoagulants/Anti thrombotics: None  GI Procedures:  None She denies family history of GI malignancy, IBD  Past Medical History:  Diagnosis Date  . Abdominal pain   . Bipolar disorder (HCC)    Bipolar  . Migraines   . UTI (urinary tract infection)     Past Surgical History:  Procedure Laterality Date  . TONSILLECTOMY  2005    Current Outpatient Medications:  .  buPROPion (WELLBUTRIN XL) 150 MG 24 hr tablet, Take 150 mg by mouth daily., Disp: , Rfl:  .  buPROPion (WELLBUTRIN XL) 300 MG 24 hr tablet, Take 300 mg by mouth daily., Disp: , Rfl:  .  levonorgestrel (MIRENA) 20 MCG/24HR IUD, 1 each by Intrauterine route once., Disp: , Rfl:  .  omeprazole (PRILOSEC) 20 MG capsule, Take 20 mg by mouth daily., Disp: , Rfl:  .  topiramate (TOPAMAX) 100 MG tablet, Take 100 mg by mouth 2 (two) times daily. , Disp: , Rfl:     Family History  Problem Relation Age of Onset  . Depression Mother   . Hyperlipidemia Mother   . Depression Father   . Hypertension Father   . ADD / ADHD Father   . Depression Brother   . ADD / ADHD Brother   . Depression Maternal Grandmother   . Diabetes Maternal Grandmother   . Hyperlipidemia Maternal Grandmother   . Hypertension Maternal Grandmother   . Alcohol abuse Maternal Grandfather   . COPD Maternal Grandfather   . Depression Maternal Grandfather   . Depression Paternal Grandmother   . Schizophrenia Paternal Grandmother   .  Drug abuse Paternal Grandmother   . Hypertension Paternal Grandmother      Social History   Tobacco Use  . Smoking status: Never Smoker  . Smokeless tobacco: Never Used  Substance Use Topics  . Alcohol use: Yes    Comment: social   . Drug use: Not Currently    Allergies as of 03/30/2020  . (No Known Allergies)     Imaging Studies: Reviewed  Assessment and Plan:   Veronica Burgess is a 29 y.o. female with history of bipolar receiving, consultation for chronic symptoms of abdominal pain, cramping in nature, bloating, nonbloody diarrhea.  Patient reports trying Bentyl in  the past which did not help  Recommend H. pylori breath test GI profile PCR to evaluate for any infectious etiology Trial of IBgard If above work-up is negative, will empirically try 2 weeks course of rifaximin 550 mg twice daily   Follow Up Instructions:   I discussed the assessment and treatment plan with the patient. The patient was provided an opportunity to ask questions and all were answered. The patient agreed with the plan and demonstrated an understanding of the instructions.   The patient was advised to call back or seek an in-person evaluation if the symptoms worsen or if the condition fails to improve as anticipated.  I provided 25 minutes of face-to-face time during this encounter.   Follow up in 6 to 8 weeks   Veronica Darby, MD

## 2020-03-31 ENCOUNTER — Other Ambulatory Visit: Payer: Self-pay

## 2020-03-31 DIAGNOSIS — K580 Irritable bowel syndrome with diarrhea: Secondary | ICD-10-CM

## 2020-03-31 DIAGNOSIS — R1013 Epigastric pain: Secondary | ICD-10-CM

## 2020-04-09 ENCOUNTER — Telehealth: Payer: Self-pay | Admitting: Primary Care

## 2020-04-09 NOTE — Telephone Encounter (Signed)
Patient said she faxed a form on Monday to be filled out, so patient can donate plasma.  Patient wants to donate on Saturday.  Patient wanted to make sure Jae Dire received the form and that she can pick it up by tomorrow. Please call patient when form is ready.

## 2020-04-09 NOTE — Telephone Encounter (Signed)
Faxed as requested

## 2020-04-09 NOTE — Telephone Encounter (Signed)
Noted. I have found the paperwork and faxed on 04/07/2020. Patient have been notified and want this to be faxed to her at [phone]

## 2020-04-09 NOTE — Telephone Encounter (Deleted)
Veronica Burgess, did you see this form that the patient is talking about. Nothing in her name was in my fax pile from [facility].

## 2020-05-12 ENCOUNTER — Other Ambulatory Visit (HOSPITAL_COMMUNITY)
Admission: RE | Admit: 2020-05-12 | Discharge: 2020-05-12 | Disposition: A | Discharge status: Home / Self Care | Payer: 59 | Source: Ambulatory Visit | Attending: Obstetrics & Gynecology | Admitting: Obstetrics & Gynecology

## 2020-05-12 ENCOUNTER — Encounter: Payer: Self-pay | Admitting: Obstetrics & Gynecology

## 2020-05-12 ENCOUNTER — Ambulatory Visit (INDEPENDENT_AMBULATORY_CARE_PROVIDER_SITE_OTHER): Payer: 59 | Admitting: Obstetrics & Gynecology

## 2020-05-12 ENCOUNTER — Other Ambulatory Visit: Payer: Self-pay

## 2020-05-12 VITALS — BP 128/65 | HR 101 | Wt 181.8 lb

## 2020-05-12 DIAGNOSIS — G8929 Other chronic pain: Secondary | ICD-10-CM

## 2020-05-12 DIAGNOSIS — R102 Pelvic and perineal pain: Secondary | ICD-10-CM

## 2020-05-12 DIAGNOSIS — Z01419 Encounter for gynecological examination (general) (routine) without abnormal findings: Secondary | ICD-10-CM

## 2020-05-12 DIAGNOSIS — R87613 High grade squamous intraepithelial lesion on cytologic smear of cervix (HGSIL): Secondary | ICD-10-CM | POA: Insufficient documentation

## 2020-05-12 DIAGNOSIS — Z30431 Encounter for routine checking of intrauterine contraceptive device: Secondary | ICD-10-CM

## 2020-05-12 MED ORDER — DROSPIRENONE-ETHINYL ESTRADIOL 3-0.03 MG PO TABS: 1.0000 | ORAL_TABLET | Freq: Every day | ORAL | 11 refills | Status: DC

## 2020-05-12 NOTE — Patient Instructions (Signed)
Preventive Care 21-30 Years Old, Female Preventive care refers to visits with your health care provider and lifestyle choices that can promote health and wellness. This includes:  A yearly physical exam. This may also be called an annual well check.  Regular dental visits and eye exams.  Immunizations.  Screening for certain conditions.  Healthy lifestyle choices, such as eating a healthy diet, getting regular exercise, not using drugs or products that contain nicotine and tobacco, and limiting alcohol use. What can I expect for my preventive care visit? Physical exam Your health care provider will check your:  Height and weight. This may be used to calculate body mass index (BMI), which tells if you are at a healthy weight.  Heart rate and blood pressure.  Skin for abnormal spots. Counseling Your health care provider may ask you questions about your:  Alcohol, tobacco, and drug use.  Emotional well-being.  Home and relationship well-being.  Sexual activity.  Eating habits.  Work and work environment.  Method of birth control.  Menstrual cycle.  Pregnancy history. What immunizations do I need?  Influenza (flu) vaccine  This is recommended every year. Tetanus, diphtheria, and pertussis (Tdap) vaccine  You may need a Td booster every 10 years. Varicella (chickenpox) vaccine  You may need this if you have not been vaccinated. Human papillomavirus (HPV) vaccine  If recommended by your health care provider, you may need three doses over 6 months. Measles, mumps, and rubella (MMR) vaccine  You may need at least one dose of MMR. You may also need a second dose. Meningococcal conjugate (MenACWY) vaccine  One dose is recommended if you are age 19-21 years and a first-year college student living in a residence hall, or if you have one of several medical conditions. You may also need additional booster doses. Pneumococcal conjugate (PCV13) vaccine  You may need  this if you have certain conditions and were not previously vaccinated. Pneumococcal polysaccharide (PPSV23) vaccine  You may need one or two doses if you smoke cigarettes or if you have certain conditions. Hepatitis A vaccine  You may need this if you have certain conditions or if you travel or work in places where you may be exposed to hepatitis A. Hepatitis B vaccine  You may need this if you have certain conditions or if you travel or work in places where you may be exposed to hepatitis B. Haemophilus influenzae type b (Hib) vaccine  You may need this if you have certain conditions. You may receive vaccines as individual doses or as more than one vaccine together in one shot (combination vaccines). Talk with your health care provider about the risks and benefits of combination vaccines. What tests do I need?  Blood tests  Lipid and cholesterol levels. These may be checked every 5 years starting at age 20.  Hepatitis C test.  Hepatitis B test. Screening  Diabetes screening. This is done by checking your blood sugar (glucose) after you have not eaten for a while (fasting).  Sexually transmitted disease (STD) testing.  BRCA-related cancer screening. This may be done if you have a family history of breast, ovarian, tubal, or peritoneal cancers.  Pelvic exam and Pap test. This may be done every 3 years starting at age 21. Starting at age 30, this may be done every 5 years if you have a Pap test in combination with an HPV test. Talk with your health care provider about your test results, treatment options, and if necessary, the need for more tests.   Follow these instructions at home: Eating and drinking   Eat a diet that includes fresh fruits and vegetables, whole grains, lean protein, and low-fat dairy.  Take vitamin and mineral supplements as recommended by your health care provider.  Do not drink alcohol if: ? Your health care provider tells you not to drink. ? You are  pregnant, may be pregnant, or are planning to become pregnant.  If you drink alcohol: ? Limit how much you have to 0-1 drink a day. ? Be aware of how much alcohol is in your drink. In the [place] one drink equals one 12 oz bottle of beer (355 mL), one 5 oz glass of wine (148 mL), or one 1 oz glass of hard liquor (44 mL). Lifestyle  Take daily care of your teeth and gums.  Stay active. Exercise for at least 30 minutes on 5 or more days each week.  Do not use any products that contain nicotine or tobacco, such as cigarettes, e-cigarettes, and chewing tobacco. If you need help quitting, ask your health care provider.  If you are sexually active, practice safe sex. Use a condom or other form of birth control (contraception) in order to prevent pregnancy and STIs (sexually transmitted infections). If you plan to become pregnant, see your health care provider for a preconception visit. What's next?  Visit your health care provider once a year for a well check visit.  Ask your health care provider how often you should have your eyes and teeth checked.  Stay up to date on all vaccines. This information is not intended to replace advice given to you by your health care provider. Make sure you discuss any questions you have with your health care provider. Document Revised: 07/26/2018 Document Reviewed: 07/26/2018 Elsevier Patient Education  [employer].    Endometriosis  Endometriosis is a condition in which the tissue that lines the uterus (endometrium) grows outside of its normal location. The tissue may grow in many locations close to the uterus, but it commonly grows on the ovaries, fallopian tubes, vagina, or bowel. When the uterus sheds the endometrium every menstrual cycle, there is bleeding wherever the endometrial tissue is located. This can cause pain because blood is irritating to tissues that are not normally exposed to it. What are the causes? The cause of endometriosis is not  known. What increases the risk? You may be more likely to develop endometriosis if you:  Have a family history of endometriosis.  Have never given birth.  Started your period at age 1 or younger.  Have high levels of estrogen in your body.  Were exposed to a certain medicine (diethylstilbestrol) before you were born (in utero).  Had low birth weight.  Were born as a twin, triplet, or other multiple.  Have a BMI of less than 25. BMI is an estimate of body fat and is calculated from height and weight. What are the signs or symptoms? Often, there are no symptoms of this condition. If you do have symptoms, they may:  Vary depending on where your endometrial tissue is growing.  Occur during your menstrual period (most common) or midcycle.  Come and go, or you may go months with no symptoms at all.  Stop with menopause. Symptoms may include:  Pain in the back or abdomen.  Heavier bleeding during periods.  Pain during sex.  Painful bowel movements.  Infertility.  Pelvic pain.  Bleeding more than once a month. How is this diagnosed? This condition is diagnosed based  on your symptoms and a physical exam. You may have tests, such as:  Blood tests and urine tests. These may be done to help rule out other possible causes of your symptoms.  Ultrasound, to look for abnormal tissues.  An X-ray of the lower bowel (barium enema).  An ultrasound that is done through the vagina (transvaginally).  CT scan.  MRI.  Laparoscopy. In this procedure, a lighted, pencil-sized instrument called a laparoscope is inserted into your abdomen through an incision. The laparoscope allows your health care provider to look at the organs inside your body and check for abnormal tissue to confirm the diagnosis. If abnormal tissue is found, your health care provider may remove a small piece of tissue (biopsy) to be examined under a microscope. How is this treated? Treatment for this condition may  include:  Medicines to relieve pain, such as NSAIDs.  Hormone therapy. This involves using artificial (synthetic) hormones to reduce endometrial tissue growth. Your health care provider may recommend using a hormonal form of birth control, or other medicines.  Surgery. This may be done to remove abnormal endometrial tissue. ? In some cases, tissue may be removed using a laparoscope and a laser (laparoscopic laser treatment). ? In severe cases, surgery may be done to remove the fallopian tubes, uterus, and ovaries (hysterectomy). Follow these instructions at home:  Take over-the-counter and prescription medicines only as told by your health care provider.  Do not drive or use heavy machinery while taking prescription pain medicine.  Try to avoid activities that cause pain, including sexual activity.  Keep all follow-up visits as told by your health care provider. This is important. Contact a health care provider if:  You have pain in the area between your hip bones (pelvic area) that occurs: ? Before, during, or after your period. ? In between your period and gets worse during your period. ? During or after sex. ? With bowel movements or urination, especially during your period.  You have problems getting pregnant.  You have a fever. Get help right away if:  You have severe pain that does not get better with medicine.  You have severe nausea and vomiting, or you cannot eat without vomiting.  You have pain that affects only the lower, right side of your abdomen.  You have abdominal pain that gets worse.  You have abdominal swelling.  You have blood in your stool. This information is not intended to replace advice given to you by your health care provider. Make sure you discuss any questions you have with your health care provider. Document Revised: 10/27/2017 Document Reviewed: 04/16/2016 Elsevier Patient Education  2020 [employer].

## 2020-05-12 NOTE — Progress Notes (Signed)
GYNECOLOGY ANNUAL PREVENTATIVE CARE ENCOUNTER NOTE  History:     Veronica Burgess is a 29 y.o. G84P1001 female here for a routine annual gynecologic exam =  Current complaints: chronic pelvic pain for several years. Had very painful periods when she was younger, mother and other family members have history of endometriosis. She was on OCPs then Depo Provera for several years and had no pain, then got pregnant. After pregnancy four years ago, her pelvic pain restarted and also has pain during and after intercourse.  Mirena IUD placed postpartum also, does not help with pain. Pain is associated with intercourse more, but can occur randomly.  Denies current abnormal vaginal bleeding, discharge, or other gynecologic concerns.    Gynecologic History No LMP recorded. (Menstrual status: IUD). Contraception: IUD placed 2017 Last Pap: 02/15/2019. Results were: normal  Obstetric History OB History  Gravida Para Term Preterm AB Living  1 1 1     1   SAB TAB Ectopic Multiple Live Births          1    # Outcome Date GA Lbr Len/2nd Weight Sex Delivery Anes PTL Lv  1 Term 10/28/13     Vag-Spont EPI  LIV    Past Medical History:  Diagnosis Date  . Abdominal pain   . Bipolar disorder (HCC)    Bipolar  . Migraines   . UTI (urinary tract infection)     Past Surgical History:  Procedure Laterality Date  . TONSILLECTOMY  2005    Current Outpatient Medications on File Prior to Visit  Medication Sig Dispense Refill  . buPROPion (WELLBUTRIN XL) 150 MG 24 hr tablet Take 150 mg by mouth daily.    2006 buPROPion (WELLBUTRIN XL) 300 MG 24 hr tablet Take 300 mg by mouth daily.    Marland Kitchen levonorgestrel (MIRENA) 20 MCG/24HR IUD 1 each by Intrauterine route once.    . topiramate (TOPAMAX) 100 MG tablet Take 100 mg by mouth 2 (two) times daily.      No current facility-administered medications on file prior to visit.    No Known Allergies  Social History:  reports that she has never smoked. She has never used  smokeless tobacco. She reports current alcohol use. She reports previous drug use.  Family History  Problem Relation Age of Onset  . Depression Mother   . Hyperlipidemia Mother   . Depression Father   . Hypertension Father   . ADD / ADHD Father   . Depression Brother   . ADD / ADHD Brother   . Depression Maternal Grandmother   . Diabetes Maternal Grandmother   . Hyperlipidemia Maternal Grandmother   . Hypertension Maternal Grandmother   . Alcohol abuse Maternal Grandfather   . COPD Maternal Grandfather   . Depression Maternal Grandfather   . Depression Paternal Grandmother   . Schizophrenia Paternal Grandmother   . Drug abuse Paternal Grandmother   . Hypertension Paternal Grandmother     The following portions of the patient's history were reviewed and updated as appropriate: allergies, current medications, past family history, past medical history, past social history, past surgical history and problem list.  Review of Systems Pertinent items noted in HPI and remainder of comprehensive ROS otherwise negative.  Physical Exam:  BP 128/65   Pulse (!) 101   Wt 181 lb 12.8 oz (82.5 kg)   BMI 34.07 kg/m  CONSTITUTIONAL: Well-developed, well-nourished female in no acute distress.  HENT:  Normocephalic, atraumatic, External right and left ear normal. Oropharynx is  clear and moist EYES: Conjunctivae and EOM are normal. Pupils are equal, round, and reactive to light. No scleral icterus.  NECK: Normal range of motion, supple, no masses.  Normal thyroid.  SKIN: Skin is warm and dry. No rash noted. Not diaphoretic. No erythema. No pallor. MUSCULOSKELETAL: Normal range of motion. No tenderness.  No cyanosis, clubbing, or edema.  2+ distal pulses. NEUROLOGIC: Alert and oriented to person, place, and time. Normal reflexes, muscle tone coordination.  PSYCHIATRIC: Normal mood and affect. Normal behavior. Normal judgment and thought content. CARDIOVASCULAR: Normal heart rate noted, regular  rhythm RESPIRATORY: Clear to auscultation bilaterally. Effort and breath sounds normal, no problems with respiration noted. BREASTS: Symmetric in size. No masses, tenderness, skin changes, nipple drainage, or lymphadenopathy bilaterally. Performed in the presence of a chaperone. ABDOMEN: Soft, no distention noted.  No tenderness, rebound or guarding.  PELVIC: Normal appearing external genitalia and urethral meatus; normal appearing vaginal mucosa and cervix.  No abnormal discharge noted. IUD strings visualized.  Pap smear obtained.  Normal uterine size, no other palpable masses, no uterine or adnexal tenderness.  Performed in the presence of a chaperone.   Assessment and Plan:      1. Chronic female pelvic pain Concerned about possible endometriosis.  Talked about laparoscopy for diagnosis, but will defer for now and presumptively treat. May not be getting adequate hormonal therapy from IUD, OCPs prescribed. Will evaluate response and follow up in 2 months.  If pain still present, consider laparoscopy, alternative treatments Freida Busman, neuropathic pelvic pain medications etc). Ultrasound ordered to evaluate for any other anomalies - drospirenone-ethinyl estradiol (YASMIN 28) 3-0.03 MG tablet; Take 1 tablet by mouth daily.  Dispense: 28 tablet; Refill: 11 - US PELVIC COMPLETE WITH TRANSVAGINAL; Future  2. Encounter for routine checking of intrauterine contraceptive device (IUD) Doing well, will check position on ultrasound.    3. Well woman exam with routine gynecological exam - Cytology - PAP Will follow up results of pap smear and manage accordingly. Routine preventative health maintenance measures emphasized. Please refer to After Visit Summary for other counseling recommendations.      Verita Schneiders, MD, [place] for [employer], [place]

## 2020-05-14 LAB — CYTOLOGY - PAP
Chlamydia: NEGATIVE
Comment: NEGATIVE
Comment: NEGATIVE
Comment: NEGATIVE
Comment: NEGATIVE
Comment: NEGATIVE
Comment: NORMAL
Diagnosis: HIGH — AB
HPV 16: POSITIVE — AB
HPV 18 / 45: NEGATIVE
High risk HPV: POSITIVE — AB
Neisseria Gonorrhea: NEGATIVE
Trichomonas: NEGATIVE

## 2020-05-15 ENCOUNTER — Encounter: Payer: Self-pay | Admitting: Obstetrics & Gynecology

## 2020-05-19 ENCOUNTER — Ambulatory Visit
Admission: RE | Admit: 2020-05-19 | Discharge: 2020-05-19 | Disposition: A | Discharge status: Home / Self Care | Payer: 59 | Source: Ambulatory Visit | Attending: Obstetrics & Gynecology | Admitting: Obstetrics & Gynecology

## 2020-05-19 DIAGNOSIS — G8929 Other chronic pain: Secondary | ICD-10-CM

## 2020-06-03 ENCOUNTER — Encounter (HOSPITAL_COMMUNITY): Payer: Self-pay | Admitting: Psychiatry

## 2020-06-04 ENCOUNTER — Other Ambulatory Visit: Payer: Self-pay

## 2020-06-04 ENCOUNTER — Ambulatory Visit (INDEPENDENT_AMBULATORY_CARE_PROVIDER_SITE_OTHER): Payer: 59 | Admitting: Psychiatry

## 2020-06-04 ENCOUNTER — Encounter (HOSPITAL_COMMUNITY): Payer: Self-pay | Admitting: Psychiatry

## 2020-06-04 DIAGNOSIS — F3181 Bipolar II disorder: Secondary | ICD-10-CM

## 2020-06-04 DIAGNOSIS — F411 Generalized anxiety disorder: Secondary | ICD-10-CM

## 2020-06-04 MED ORDER — LAMOTRIGINE ER 100 MG PO TB24: 100.0000 mg | ORAL_TABLET | Freq: Every evening | ORAL | 0 refills | Status: DC

## 2020-06-04 MED ORDER — LAMOTRIGINE ER 25 MG PO TB24: ORAL_TABLET | ORAL | 0 refills | Status: DC
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
## 2020-06-11 ENCOUNTER — Ambulatory Visit (INDEPENDENT_AMBULATORY_CARE_PROVIDER_SITE_OTHER): Payer: 59 | Admitting: Obstetrics & Gynecology

## 2020-06-11 ENCOUNTER — Other Ambulatory Visit: Payer: Self-pay

## 2020-06-11 ENCOUNTER — Encounter: Payer: Self-pay | Admitting: Obstetrics & Gynecology

## 2020-06-11 ENCOUNTER — Other Ambulatory Visit: Payer: Self-pay | Admitting: Obstetrics & Gynecology

## 2020-06-11 VITALS — BP 121/78 | HR 96 | Wt 187.4 lb

## 2020-06-11 DIAGNOSIS — N870 Mild cervical dysplasia: Secondary | ICD-10-CM

## 2020-06-11 DIAGNOSIS — R87613 High grade squamous intraepithelial lesion on cytologic smear of cervix (HGSIL): Secondary | ICD-10-CM

## 2020-06-11 NOTE — Patient Instructions (Signed)

## 2020-06-11 NOTE — Progress Notes (Signed)
    GYNECOLOGY CLINIC COLPOSCOPY PROCEDURE NOTE  29 y.o. 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 Discussed role for HPV in cervical dysplasia, need for surveillance.  Patient given informed consent, signed copy in the chart, time out was performed.  Placed in lithotomy position. Cervix viewed with speculum and colposcope after application of acetic acid.   Colposcopy adequate? Yes Acetowhite lesion(s) noted at 1 o'clock; corresponding biopsy obtained.  ECC specimen obtained. All specimens were labeled and sent to pathology.  Patient was given post procedure instructions.  Will follow up pathology and manage accordingly; patient will be contacted with results and recommendations.  Routine preventative health maintenance measures emphasized.    Jaynie Collins, MD, FACOG Obstetrician & Gynecologist, [facility] for [employer], [facility]

## 2020-06-15 ENCOUNTER — Encounter: Payer: Self-pay | Admitting: *Deleted

## 2020-07-06 ENCOUNTER — Other Ambulatory Visit: Payer: Self-pay | Admitting: *Deleted

## 2020-07-06 MED ORDER — METRONIDAZOLE 500 MG PO TABS: 500.0000 mg | ORAL_TABLET | Freq: Two times a day (BID) | ORAL | 0 refills | Status: DC

## 2020-07-07 ENCOUNTER — Ambulatory Visit: Payer: 59 | Admitting: Obstetrics & Gynecology

## 2020-07-09 ENCOUNTER — Ambulatory Visit
Admission: RE | Admit: 2020-07-09 | Discharge: 2020-07-09 | Disposition: A | Discharge status: Home / Self Care | Payer: 59 | Source: Ambulatory Visit | Attending: Physician Assistant | Admitting: Physician Assistant

## 2020-07-09 ENCOUNTER — Other Ambulatory Visit: Payer: Self-pay

## 2020-07-09 VITALS — BP 116/72 | HR 80 | Temp 98.9°F | Resp 18

## 2020-07-09 DIAGNOSIS — R0981 Nasal congestion: Secondary | ICD-10-CM

## 2020-07-09 DIAGNOSIS — R519 Headache, unspecified: Secondary | ICD-10-CM | POA: Diagnosis not present

## 2020-07-09 MED ORDER — KETOROLAC TROMETHAMINE 15 MG/ML IJ SOLN
15.0000 mg | Freq: Once | INTRAMUSCULAR | Status: AC
  Administered 2020-07-09: 15 mg via INTRAMUSCULAR

## 2020-07-09 MED ORDER — FLUTICASONE PROPIONATE 50 MCG/ACT NA SUSP
2.0000 | Freq: Every day | NASAL | 0 refills | Status: DC
Start: 2020-07-09 — End: 2020-07-22

## 2020-07-09 MED ORDER — DEXAMETHASONE SODIUM PHOSPHATE 10 MG/ML IJ SOLN
10.0000 mg | Freq: Once | INTRAMUSCULAR | Status: AC
  Administered 2020-07-09: 10 mg via INTRAMUSCULAR

## 2020-07-09 MED ORDER — PREDNISONE 50 MG PO TABS
50.0000 mg | ORAL_TABLET | Freq: Every day | ORAL | 0 refills | Status: DC
Start: 2020-07-09 — End: 2020-07-22

## 2020-07-09 MED ORDER — METOCLOPRAMIDE HCL 5 MG/ML IJ SOLN
5.0000 mg | Freq: Once | INTRAMUSCULAR | Status: AC
  Administered 2020-07-09: 5 mg via INTRAMUSCULAR

## 2020-07-09 NOTE — Discharge Instructions (Signed)
Toradol, decadron, reglan injection in office today. Start flonase as directed. If symptoms not improving tomorrow, can start prednisone for sinus pressure. Otherwise, follow up with PCP for further evaluation for frequent headaches.

## 2020-07-09 NOTE — ED Provider Notes (Signed)
[name]    CSN: [id number] Arrival date & time: 07/09/20  1200      History   Chief Complaint Chief Complaint  Patient presents with   APPT 12 - migraine    HPI Veronica Burgess is a 29 y.o. female.   29 year old female comes in for 2 day history of frontal headache with nausea. States stabbing sensation that turns into dull pain with medicine. Denies photophobia, phonophobia. Nausea without vomiting. COVID positive 1 month ago and still with residual nasal congestion, cough. This has improved since COVID without worsening. Denies fever, shob. Denies syncope. Excedrin with mild relief.   History of migraines, not on preventative medications. No abortive medications.      Past Medical History:  Diagnosis Date   Abdominal pain    Bipolar disorder (HCC)    Bipolar   Migraines    UTI (urinary tract infection)     Patient Active Problem List   Diagnosis Date Noted   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 05/12/2020   Chronic abdominal pain 02/03/2020   Rectal bleeding 02/03/2020   Bipolar 2 disorder (HCC) 02/03/2020   Migraines 02/03/2020   Bilateral hand pain 02/03/2020   GAD (generalized anxiety disorder) 02/13/2018    Past Surgical History:  Procedure Laterality Date   TONSILLECTOMY  2005    OB History    Gravida  1   Para  1   Term  1   Preterm      AB      Living  1     SAB      TAB      Ectopic      Multiple      Live Births  1            Home Medications    Prior to Admission medications   Medication Sig Start Date End Date Taking? Authorizing Provider  buPROPion (WELLBUTRIN XL) 150 MG 24 hr tablet Take 150 mg by mouth daily.    [provider]  buPROPion (WELLBUTRIN XL) 300 MG 24 hr tablet Take 300 mg by mouth daily.    [provider]  drospirenone-ethinyl estradiol (YASMIN 28) 3-0.03 MG tablet Take 1 tablet by mouth daily. 05/12/20   Anyanwu, Jethro Bastos, MD    fluticasone (FLONASE) 50 MCG/ACT nasal spray Place 2 sprays into both nostrils daily. 07/09/20   Belinda Fisher, PA-C  LamoTRIgine (LAMICTAL XR) 25 MG TB24 24 hour tablet Take 1 tablet (25 mg total) by mouth at bedtime for 14 days, THEN 2 tablets (50 mg total) at bedtime for 14 days. 06/04/20 07/02/20  Pucilowski, Roosvelt Maser, MD  levonorgestrel (MIRENA) 20 MCG/24HR IUD 1 each by Intrauterine route once. 02/01/17   [provider]  metroNIDAZOLE (FLAGYL) 500 MG tablet Take 1 tablet (500 mg total) by mouth 2 (two) times daily. 07/06/20   Anyanwu, Jethro Bastos, MD  predniSONE (DELTASONE) 50 MG tablet Take 1 tablet (50 mg total) by mouth daily with breakfast. 07/09/20   Belinda Fisher, PA-C    Family History Family History  Problem Relation Age of Onset   Depression Mother    Hyperlipidemia Mother    Depression Father    Hypertension Father    ADD / ADHD Father    Depression Brother    ADD / ADHD Brother    Depression Maternal Grandmother    Diabetes Maternal Grandmother    Hyperlipidemia Maternal Grandmother  Hypertension Maternal Grandmother    Alcohol abuse Maternal Grandfather    COPD Maternal Grandfather    Depression Maternal Grandfather    Depression Paternal Grandmother    Schizophrenia Paternal Grandmother    Drug abuse Paternal Grandmother    Hypertension Paternal Grandmother     Social History Social History   Tobacco Use   Smoking status: Never Smoker   Smokeless tobacco: Never Used  Building services engineer Use: Never used  Substance Use Topics   Alcohol use: Yes    Comment: social    Drug use: Not Currently     Allergies   Patient has no known allergies.   Review of Systems Review of Systems  Reason unable to perform ROS: See HPI as above.     Physical Exam Triage Vital Signs ED Triage Vitals  Enc Vitals Group     BP 07/09/20 1209 116/72     Pulse Rate 07/09/20 1209 80     Resp 07/09/20 1209 18     Temp 07/09/20 1209 98.9 F (37.2 C)      Temp Source 07/09/20 1209 Oral     SpO2 07/09/20 1209 97 %     Weight --      Height --      Head Circumference --      Peak Flow --      Pain Score 07/09/20 1213 5     Pain Loc --      Pain Edu? --      Excl. in GC? --    No data found.  Updated Vital Signs BP 116/72 (BP Location: Left Arm)    Pulse 80    Temp 98.9 F (37.2 C) (Oral)    Resp 18    SpO2 97%   Physical Exam Constitutional:      General: She is not in acute distress.    Appearance: Normal appearance. She is well-developed. She is not ill-appearing, toxic-appearing or diaphoretic.  HENT:     Head: Normocephalic and atraumatic.     Right Ear: Tympanic membrane, ear canal and external ear normal. Tympanic membrane is not erythematous or bulging.     Left Ear: Tympanic membrane, ear canal and external ear normal. Tympanic membrane is not erythematous or bulging.     Nose:     Right Sinus: Frontal sinus tenderness present. No maxillary sinus tenderness.     Left Sinus: Frontal sinus tenderness present. No maxillary sinus tenderness.     Mouth/Throat:     Mouth: Mucous membranes are moist.     Pharynx: Oropharynx is clear. Uvula midline.  Eyes:     Conjunctiva/sclera: Conjunctivae normal.     Pupils: Pupils are equal, round, and reactive to light.  Cardiovascular:     Rate and Rhythm: Normal rate and regular rhythm.  Pulmonary:     Effort: Pulmonary effort is normal. No accessory muscle usage, prolonged expiration, respiratory distress or retractions.     Breath sounds: No decreased air movement or transmitted upper airway sounds. No decreased breath sounds.     Comments: LCTAB Musculoskeletal:     Cervical back: Normal range of motion and neck supple.  Skin:    General: Skin is warm and dry.  Neurological:     Mental Status: She is alert and oriented to person, place, and time.      UC Treatments / Results  Labs (all labs ordered are listed, but only abnormal results are displayed) Labs Reviewed - No  data  to display  EKG   Radiology No results found.  Procedures Procedures (including critical care time)  Medications Ordered in UC Medications  metoCLOPramide (REGLAN) injection 5 mg (has no administration in time range)  dexamethasone (DECADRON) injection 10 mg (has no administration in time range)  ketorolac (TORADOL) 15 MG/ML injection 15 mg (has no administration in time range)    Initial Impression / Assessment and Plan / UC Course  I have reviewed the triage vital signs and the nursing notes.  Pertinent labs & imaging results that were available during my care of the patient were reviewed by me and considered in my medical decision making (see chart for details).    Toradol, Decadron, Reglan injection in office today.  Will start Flonase as directed for sinus pressure.  Rx of prednisone provided, if symptoms not improving with injection, can start as directed.  Otherwise to follow-up with PCP for further evaluation if symptoms not improving.  Return precautions given.  Final Clinical Impressions(s) / UC Diagnoses   Final diagnoses:  Frontal headache  Nasal congestion    ED Prescriptions    Medication Sig Dispense Auth. Provider   fluticasone (FLONASE) 50 MCG/ACT nasal spray Place 2 sprays into both nostrils daily. 1 g Abdalrahman Clementson V, PA-C   predniSONE (DELTASONE) 50 MG tablet Take 1 tablet (50 mg total) by mouth daily with breakfast. 5 tablet Belinda Fisher, PA-C     PDMP not reviewed this encounter.   Belinda Fisher, PA-C 07/09/20 1302

## 2020-07-09 NOTE — ED Triage Notes (Signed)
Pt c/o migraine headache with nausea x2 days. States had coivd a month ago and still having cough, nasal congestion, chills, and fatigue.

## 2020-07-10 ENCOUNTER — Telehealth: Payer: Self-pay | Admitting: Primary Care

## 2020-07-10 NOTE — Telephone Encounter (Signed)
Pt called wanting to see if someone would call her in something for migraine.  She has had this since Wednesday and went to urgent care yesterday and they gave her 3 different shot.  It help for a while. But she got up this morning with migraine again.  She has already taken today 2 aleive 2 tylenol 2 excedrin migraine and 2 advil  Nothing has helped .  She spaced these meds out.   [facility] [address] Pt has appointment with kate on Wednesday  Please advise

## 2020-07-13 ENCOUNTER — Other Ambulatory Visit: Payer: Self-pay

## 2020-07-13 ENCOUNTER — Telehealth (INDEPENDENT_AMBULATORY_CARE_PROVIDER_SITE_OTHER): Payer: 59 | Admitting: Psychiatry

## 2020-07-13 DIAGNOSIS — F411 Generalized anxiety disorder: Secondary | ICD-10-CM

## 2020-07-13 DIAGNOSIS — F3181 Bipolar II disorder: Secondary | ICD-10-CM | POA: Diagnosis not present

## 2020-07-13 MED ORDER — BUPROPION HCL ER (XL) 450 MG PO TB24: 450.0000 mg | ORAL_TABLET | Freq: Every day | ORAL | 2 refills | Status: DC

## 2020-07-13 MED ORDER — LAMOTRIGINE ER 200 MG PO TB24: 200.0000 mg | ORAL_TABLET | Freq: Every evening | ORAL | 0 refills | Status: DC

## 2020-07-13 NOTE — Telephone Encounter (Signed)
Spoken to patient and apologized that the message was sent after I have left for the day. Patient stated that she is better. Will discuss further with patient on appointment on 07/15/2020

## 2020-07-13 NOTE — Progress Notes (Signed)
BH MD/PA/NP OP Progress Note  07/13/2020 10:21 AM Envy Meno  MRN:  [id number] Interview was conducted using videoconferencing application and I verified that I was speaking with the correct person using two identifiers. I discussed the limitations of evaluation and management by telemedicine and  the availability of in person appointments. Patient expressed understanding and agreed to proceed. Patient location - work; physician - home office.  Chief Complaint: None.  HPI: 29 yo single white female who has been followed for bipolar disorder and anxiety by Barbette Merino at [facility] since April of 2019. Patient's insurance changed and she comes to establish regular psychiatric follow up at [facility] psychiatry. Shaqueta reports that her mood problems started after birth of her daughter 6 years ago - she developed post-partum depression, then anxiety, then mood swings. She reports having week long (somethimes longer) periods of increased activity, energy, racing thoughts, decreased need for sleep. During such periods she is more productive, accomplishes more both at work and at home. She tends to clean excessively but denies engaging in high risk behaviors. These periods are typically followed by depressive episodes of similar length. She feels down, unmotivated, tired, focuses poorly and oversleeps. She has multiple episodes (both types) in a year. Additionally she has a tendency to ruminate, worry excessively and this is more pronounced during depressive episodes. She has tried several medications for mood/anxiety: sertraline (became "manic" on it), buspirone, aripiprazole, quetiapine - felt tired and gained weight on both.  She is currently on bupropion XL 450 mg and topiramate 100 mg bid as a "mood stabilizer". Interestingly she has never been tried on mood stabilizers - lithium, divalproate, carbamazepine, lamotrigine or oxcarbazepine. She wants to avoid taking medications that carry  risk of weight gain or sedation. WE have added Lamictal (her insurance plan covers XR but not regular form) which she now is at 100 mg at HS for a week. She tolerates it well. Mood at this time stable. She was also prescribed clonazepam 0.5 mg bid for anxiety but never took it out of worry that it will make her more sedated. She does not reports having panic attacks.  Visit Diagnosis:    ICD-10-CM   1. Bipolar 2 disorder (HCC)  F31.81   2. GAD (generalized anxiety disorder)  F41.1     Past Psychiatric History: Plase see intake H&P.  Past Medical History:  Past Medical History:  Diagnosis Date  . Abdominal pain   . Bipolar disorder (HCC)    Bipolar  . Migraines   . UTI (urinary tract infection)     Past Surgical History:  Procedure Laterality Date  . TONSILLECTOMY  2005    Family Psychiatric History: Reviewed.  Family History:  Family History  Problem Relation Age of Onset  . Depression Mother   . Hyperlipidemia Mother   . Depression Father   . Hypertension Father   . ADD / ADHD Father   . Depression Brother   . ADD / ADHD Brother   . Depression Maternal Grandmother   . Diabetes Maternal Grandmother   . Hyperlipidemia Maternal Grandmother   . Hypertension Maternal Grandmother   . Alcohol abuse Maternal Grandfather   . COPD Maternal Grandfather   . Depression Maternal Grandfather   . Depression Paternal Grandmother   . Schizophrenia Paternal Grandmother   . Drug abuse Paternal Grandmother   . Hypertension Paternal Grandmother     Social History:  Social History   Socioeconomic History  . Marital status: Single  Spouse name: Not on file  . Number of children: 1  . Years of education: Not on file  . Highest education level: Not on file  Occupational History  . Occupation: Environmental health practitioner  Tobacco Use  . Smoking status: Never Smoker  . Smokeless tobacco: Never Used  Vaping Use  . Vaping Use: Never used  Substance and Sexual Activity  . Alcohol  use: Yes    Comment: social   . Drug use: Not Currently  . Sexual activity: Yes    Birth control/protection: I.U.D., Pill  Other Topics Concern  . 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   Lives with her 59 yo daughter, boyfriend.   Social Determinants of Health   Financial Resource Strain:   . Difficulty of Paying Living Expenses:   Food Insecurity:   . Worried About Programme researcher, broadcasting/film/video in the Last Year:   . Barista in the Last Year:   Transportation Needs:   . Freight forwarder (Medical):   Marland Kitchen Lack of Transportation (Non-Medical):   Physical Activity:   . Days of Exercise per Week:   . Minutes of Exercise per Session:   Stress:   . Feeling of Stress :   Social Connections:   . Frequency of Communication with Friends and Family:   . Frequency of Social Gatherings with Friends and Family:   . Attends Religious Services:   . Active Member of Clubs or Organizations:   . Attends Banker Meetings:   Marland Kitchen Marital Status:     Allergies: No Known Allergies  Metabolic Disorder Labs: No results found for: HGBA1C, MPG No results found for: PROLACTIN No results found for: CHOL, TRIG, HDL, CHOLHDL, VLDL, LDLCALC Lab Results  Component Value Date   TSH 2.273 01/28/2020    Therapeutic Level Labs: No results found for: LITHIUM No results found for: VALPROATE No components found for:  CBMZ  Current Medications: Current Outpatient Medications  Medication Sig Dispense Refill  . [START ON 07/20/2020] buPROPion 450 MG TB24 Take 450 mg by mouth daily. 30 tablet 2  . drospirenone-ethinyl estradiol (YASMIN 28) 3-0.03 MG tablet Take 1 tablet by mouth daily. 28 tablet 11  . fluticasone (FLONASE) 50 MCG/ACT nasal spray Place 2 sprays into both nostrils daily. 1 g 0  . LamoTRIgine 200 MG TB24 24 hour tablet Take 1 tablet (200 mg total) by mouth at  bedtime. 30 tablet 0  . levonorgestrel (MIRENA) 20 MCG/24HR IUD 1 each by Intrauterine route once.    . metroNIDAZOLE (FLAGYL) 500 MG tablet Take 1 tablet (500 mg total) by mouth 2 (two) times daily. 14 tablet 0  . predniSONE (DELTASONE) 50 MG tablet Take 1 tablet (50 mg total) by mouth daily with breakfast. 5 tablet 0   No current facility-administered medications for this visit.     Psychiatric Specialty Exam: Review of Systems  Psychiatric/Behavioral: The patient is nervous/anxious.   All other systems reviewed and are negative.   There were no vitals taken for this visit.There is no height or weight on file to calculate BMI.  General Appearance: Casual and Well Groomed  Eye Contact:  Good  Speech:  Clear and Coherent and Normal Rate  Volume:  Normal  Mood:  Euthymic  Affect:  Full Range  Thought Process:  Goal Directed  Orientation:  Full (Time, Place, and Person)  Thought Content: Logical  Suicidal Thoughts:  No  Homicidal Thoughts:  No  Memory:  Immediate;   Good Recent;   Good Remote;   Good  Judgement:  Good  Insight:  Good  Psychomotor Activity:  Normal  Concentration:  Concentration: Good  Recall:  Good  Fund of Knowledge: Good  Language: Good  Akathisia:  Negative  Handed:  Right  AIMS (if indicated): not done  Assets:  Communication Skills Desire for Improvement Financial Resources/Insurance Housing Intimacy Talents/Skills  ADL's:  Intact  Cognition: WNL  Sleep:  Good    Assessment and Plan: 29 yo single white female who has been followed for bipolar disorder and anxiety by Barbette Merino at [facility] since April of 2019. Patient's insurance changed and she comes to establish regular psychiatric follow up at [facility] psychiatry. Arsenia reports that her mood problems started after birth of her daughter 6 years ago - she developed post-partum depression, then anxiety, then mood swings. She reports having week long (somethimes longer)  periods of increased activity, energy, racing thoughts, decreased need for sleep. During such periods she is more productive, accomplishes more both at work and at home. She tends to clean excessively but denies engaging in high risk behaviors. These periods are typically followed by depressive episodes of similar length. She feels down, unmotivated, tired, focuses poorly and oversleeps. She has multiple episodes (both types) in a year. Additionally she has a tendency to ruminate, worry excessively and this is more pronounced during depressive episodes. She has tried several medications for mood/anxiety: sertraline (became "manic" on it), buspirone, aripiprazole, quetiapine - felt tired and gained weight on both.  She is currently on bupropion XL 450 mg and topiramate 100 mg bid as a "mood stabilizer". Interestingly she has never been tried on mood stabilizers - lithium, divalproate, carbamazepine, lamotrigine or oxcarbazepine. She wants to avoid taking medications that carry risk of weight gain or sedation. WE have added Lamictal (her insurance plan covers XR but not regular form) which she now is at 100 mg at HS for a week. She tolerates it well. Mood at this time stable. She was also prescribed clonazepam 0.5 mg bid for anxiety but never took it out of worry that it will make her more sedated. She does not reports having panic attacks.  Dx: Bipolar 2 disorder rapid cycling; GAD  Plan: We will continue Lamictal titration with a target dose of 200 mg at HS. I will continue Wellbutrin XL 450 mg for now.  Next appointment in 5 weeks. The plan was discussed with patient who had an opportunity to ask questions and these were all answered. I spend 15 minutes in videoconferencing with the patient     Magdalene Patricia, MD 07/13/2020, 10:21 AM

## 2020-07-15 ENCOUNTER — Ambulatory Visit: Payer: 59 | Admitting: Primary Care

## 2020-07-16 IMAGING — US US PELVIS COMPLETE WITH TRANSVAGINAL
1 series · 14 of 25 positions shown · non-contrast
Comparison: None

CLINICAL DATA: Chronic pelvic pain for 2 years, has IUD in place

EXAM:
TRANSABDOMINAL AND TRANSVAGINAL ULTRASOUND OF PELVIS
TECHNIQUE: Both transabdominal and transvaginal ultrasound examinations of the
pelvis were performed. Transabdominal technique was performed for
global imaging of the pelvis including uterus, ovaries, adnexal
regions, and pelvic cul-de-sac. It was necessary to proceed with
endovaginal exam following the transabdominal exam to visualize the
endometrium and IUD.

[Series 1: us pelvis complete with transvaginal · 0.19mm/px · 14 of 84 slices shown]
[im 1/84]
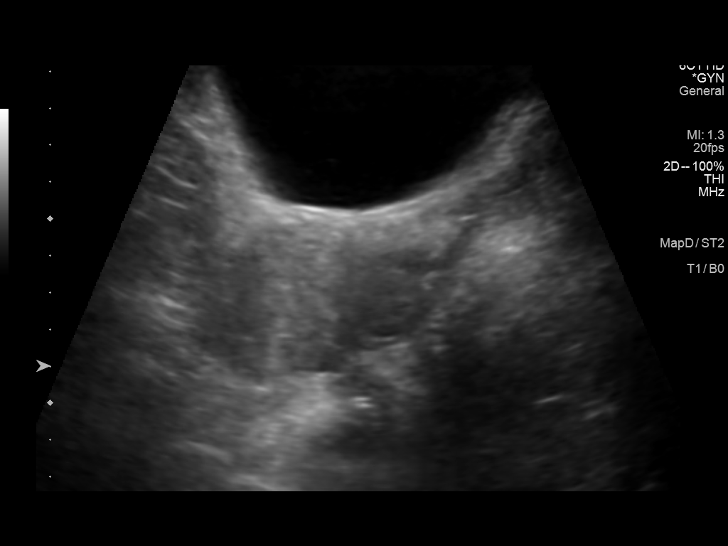
[im 7/84]
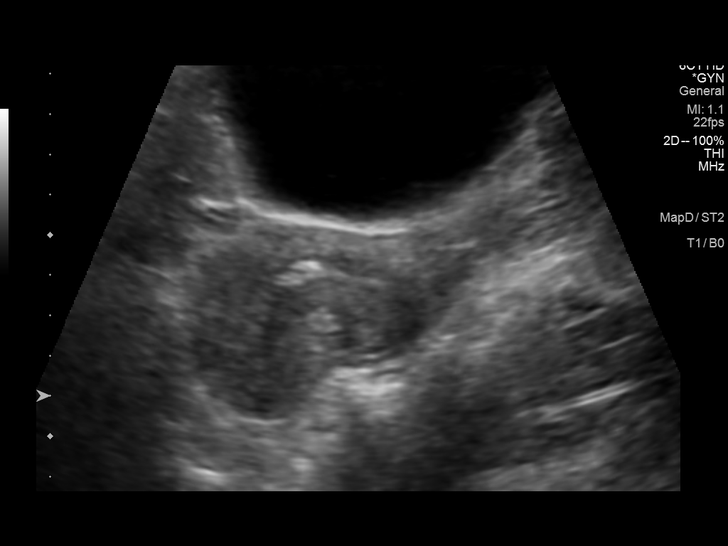
[im 14/84]
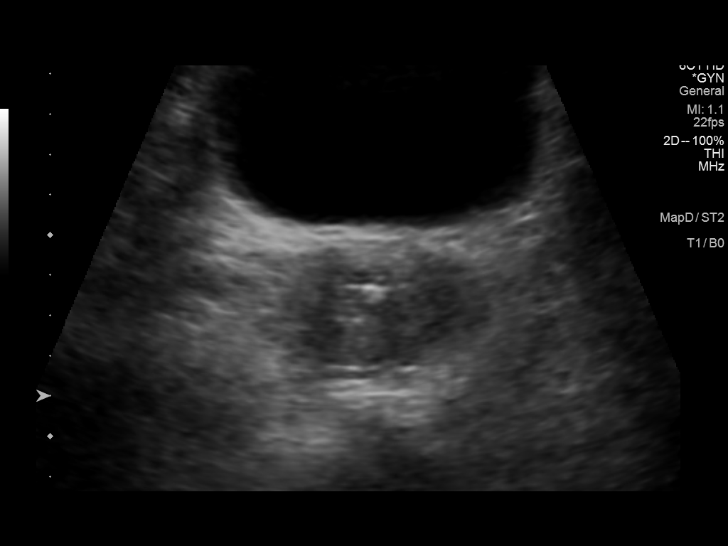
[im 21/84]
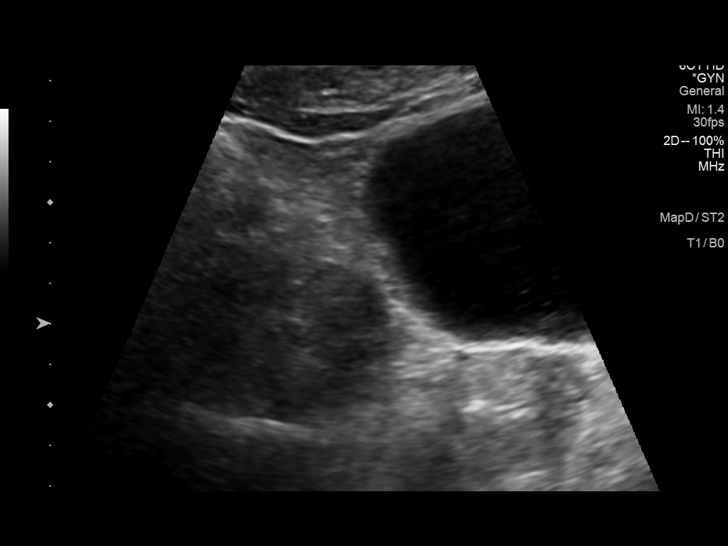
[im 28/84]
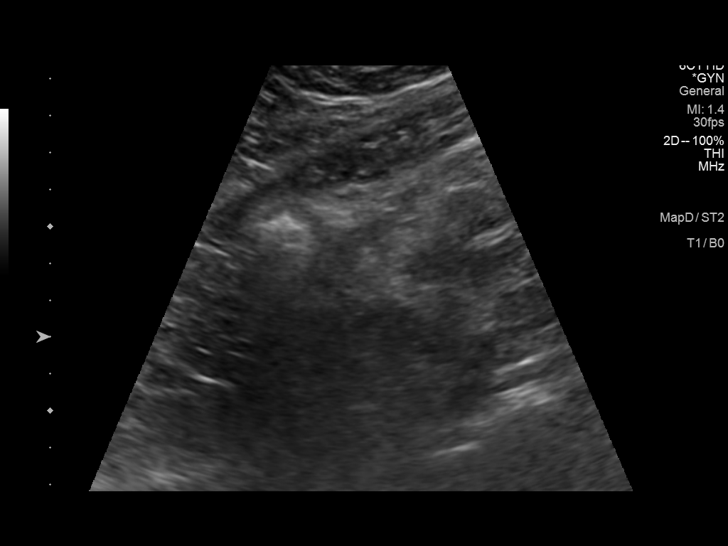
[im 32/84]
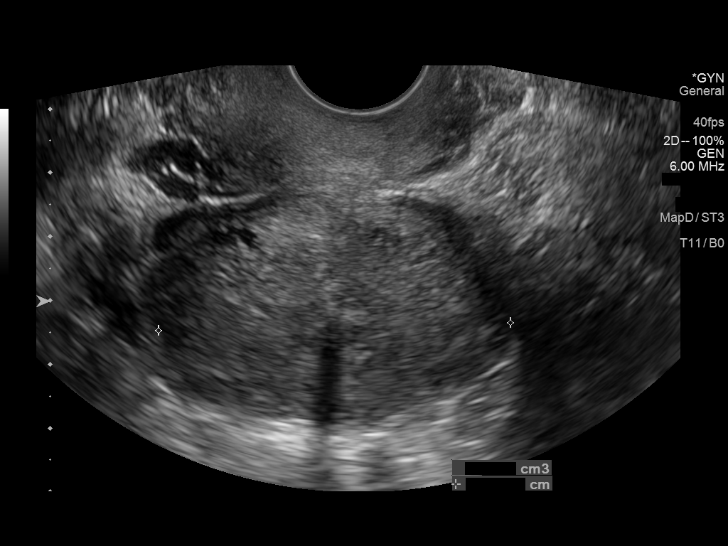
[im 39/84]
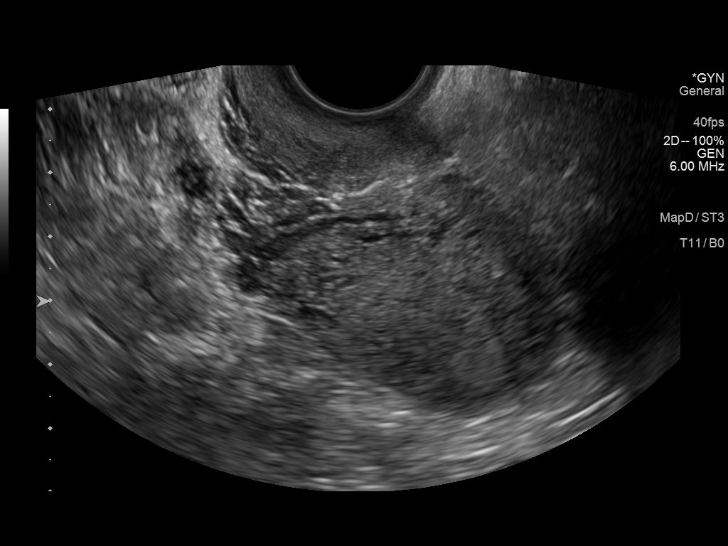
[im 45/84]
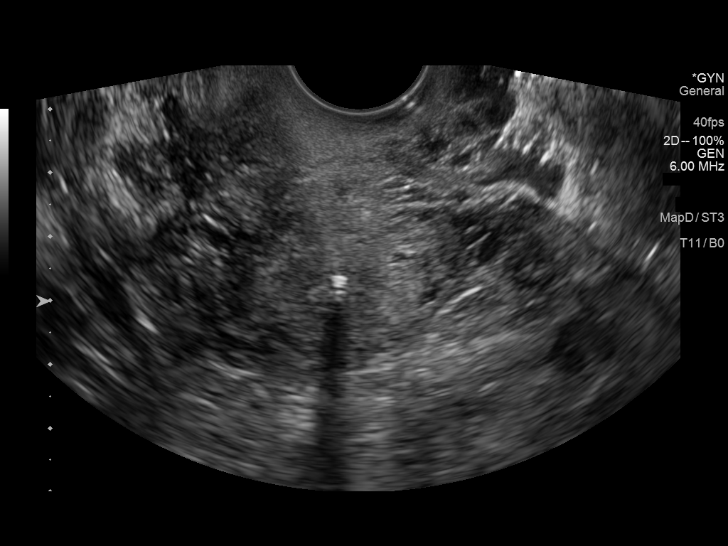
[im 52/84]
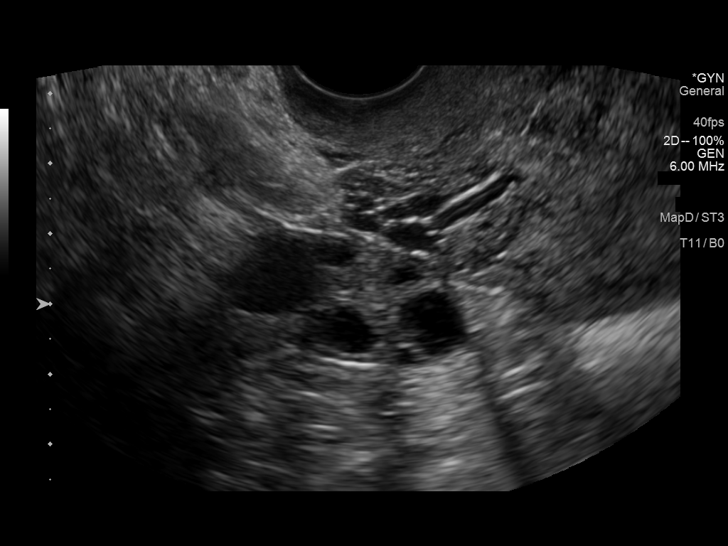
[im 56/84]
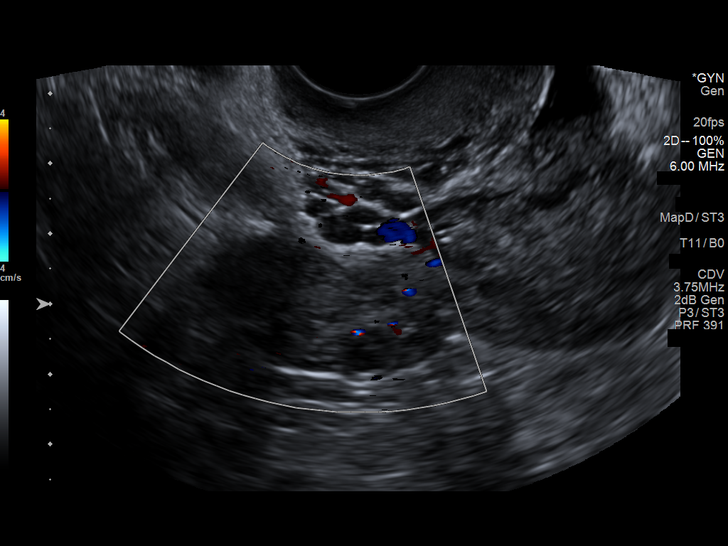
[im 63/84]
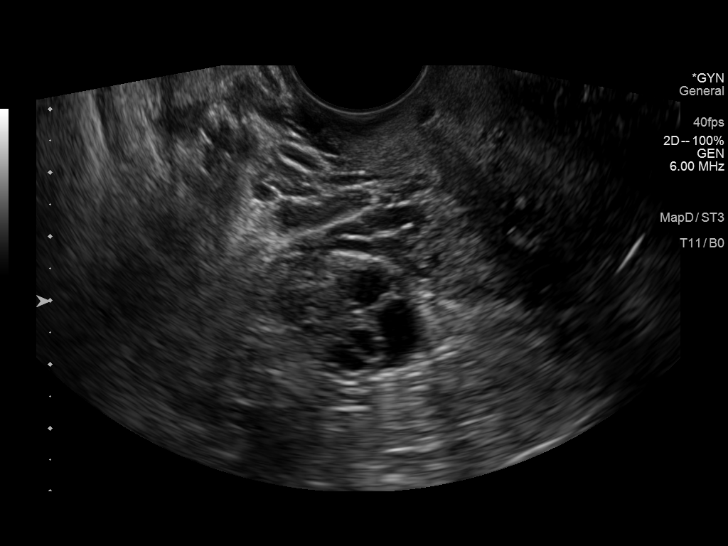
[im 70/84]
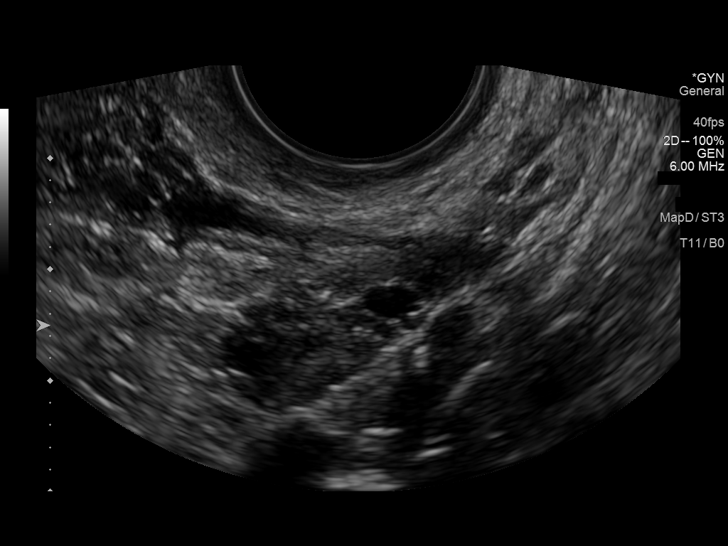
[im 77/84]
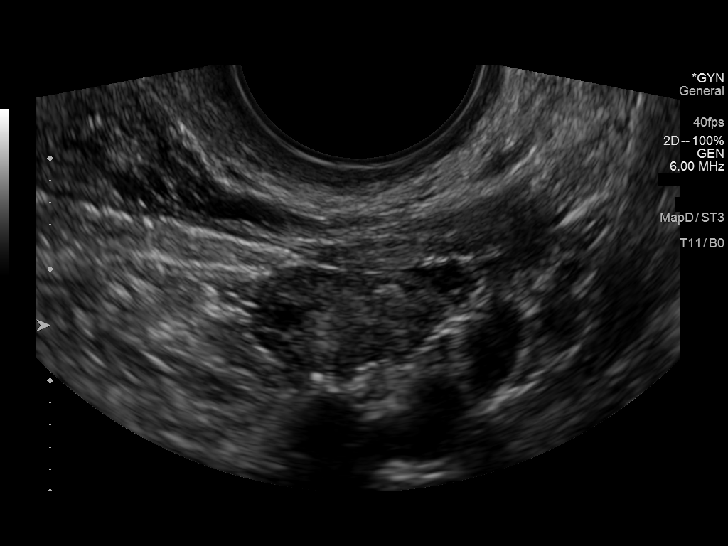
[im 84/84]
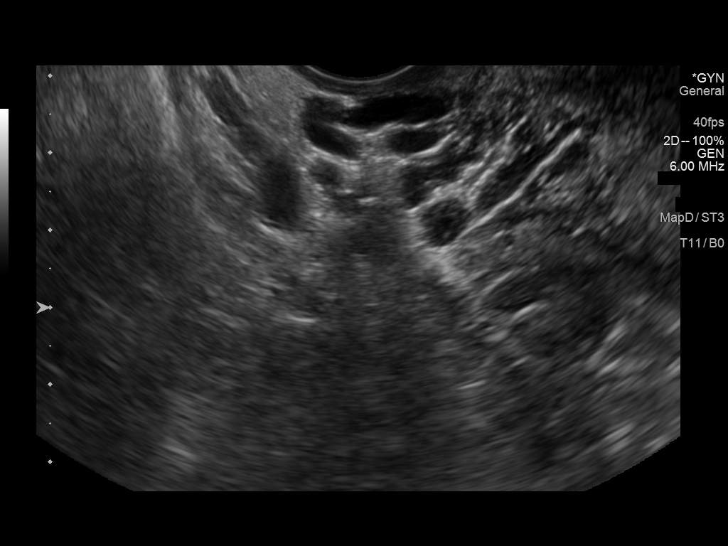

[14 of 25 positions shown; findings below may reference images not displayed]

FINDINGS: Uterus

Measurements: 6.9 x 3.9 x 5.5 cm = volume: 78.2 mL. Retroverted.
Heterogeneous myometrium. No focal mass.

Endometrium

Thickness: 5 mm. IUD in expected position in upper uterine segment
endometrial canal. No endometrial fluid.

Right ovary

Measurements: 3.5 x 1.8 x 2.0 cm = volume: 6.5 mL. Normal morphology
without mass

Left ovary

Measurements: 3.0 x 1.1 x 1.1 cm = volume: 2.0 mL. Normal morphology
without mass

Other findings

Trace free pelvic fluid, potentially physiologic. No adnexal masses.
IMPRESSION: IUD in expected position at the upper uterine segment endometrial
canal.

Remainder of exam unremarkable.

## 2020-07-21 ENCOUNTER — Telehealth (HOSPITAL_COMMUNITY): Payer: Self-pay

## 2020-07-21 NOTE — Telephone Encounter (Signed)
[employer] COVERAGE DENIED  BUPROPION XL 450MG  TABLET

## 2020-07-21 NOTE — Telephone Encounter (Signed)
[employer] COVERAGE DENIED  BUPROPION XL 450MG  TABLET  RECEIVED FAX STATING THAT PATIENT'S BUBROPION XL WAS DENIED BECAUSE SHE HAS NOT HAD A "TRIAL AND FAILURE, OR INTOLERANCE, TO (POOR RESPONSE OR WAS NOT ABLE TO TAKE) LONG-ACTING GENERIC BUBROPION TABLETS - BUPROPION SR AND BUPROPION XL.  THE OTHERS LISTED/FORMULARY ALTERNATIVES THAT SHE HAS TO TRY AND FAIL AT LEAST 3 OF ARE: CITALOPRAM, ESCITALOPRAM FLUVOXAMINE, AND PAROXETINE.   PLEASE REVIEW AND ADVISE. THANK YOU.

## 2020-07-22 ENCOUNTER — Encounter: Payer: Self-pay | Admitting: Primary Care

## 2020-07-22 ENCOUNTER — Ambulatory Visit (INDEPENDENT_AMBULATORY_CARE_PROVIDER_SITE_OTHER): Payer: 59 | Admitting: Primary Care

## 2020-07-22 ENCOUNTER — Other Ambulatory Visit: Payer: Self-pay

## 2020-07-22 ENCOUNTER — Other Ambulatory Visit (HOSPITAL_COMMUNITY): Payer: Self-pay | Admitting: Psychiatry

## 2020-07-22 VITALS — BP 116/76 | HR 87 | Temp 96.6°F | Ht 61.25 in | Wt 187.2 lb

## 2020-07-22 DIAGNOSIS — Z3200 Encounter for pregnancy test, result unknown: Secondary | ICD-10-CM | POA: Diagnosis not present

## 2020-07-22 DIAGNOSIS — R519 Headache, unspecified: Secondary | ICD-10-CM | POA: Diagnosis not present

## 2020-07-22 DIAGNOSIS — G43709 Chronic migraine without aura, not intractable, without status migrainosus: Secondary | ICD-10-CM

## 2020-07-22 MED ORDER — PROPRANOLOL HCL ER 80 MG PO CP24: 80.0000 mg | ORAL_CAPSULE | Freq: Every day | ORAL | 0 refills | Status: DC

## 2020-07-22 MED ORDER — BUPROPION HCL ER (XL) 150 MG PO TB24: 450.0000 mg | ORAL_TABLET | Freq: Every day | ORAL | 2 refills | Status: DC

## 2020-07-22 NOTE — Patient Instructions (Signed)
Your recent lab tests have resulted and I've released them to My Chart. My comments are displayed below.   Start propranolol ER 80 mg for headache prevention. Take once daily.  Please update me in a few weeks.  It was a pleasure to see you today!

## 2020-07-22 NOTE — Assessment & Plan Note (Signed)
Increased and now with nearly daily headaches.  Suspect Topamax was helping but she cannot tolerate due to psychological side effects. Neuro exam today overall unremarkable.  Discussed options for treatment, we will trial propranolol ER 80 mg daily. She will update in a few weeks.

## 2020-07-22 NOTE — Progress Notes (Signed)
Subjective:    Patient ID: Veronica Burgess, female    DOB: 1991/04/09, 29 y.o.   MRN: [id number]  HPI  This visit occurred during the SARS-CoV-2 public health emergency.  Safety protocols were in place, including screening questions prior to the visit, additional usage of staff PPE, and extensive cleaning of exam room while observing appropriate contact time as indicated for disinfecting solutions.   Veronica Burgess is a 29 year old female with a history of migraines who presents today to discuss migraines.  She was evaluated at Urgent Care on 07/09/20 with a chief complaint of a two day history of frontal headache. Also with nausea and photophobia. She tested positive for Covid-19 about one month prior, was still experiencing residual symptoms.   During her UC visit she was provided with IM Decadron, Reglan, and Toradol. She was discharged home with prescriptions for prednisone and Flonase.   Today she endorses that UC treatment helped for about one day, then symptoms have been intermittent since. She's been taking Excedrin and Advil, and resting in a dark room without resolve but some improvement. She continues to notice nausea and fatigue.  She was previously managed on Topamax for Bipolar Disorder, was removed due to bad effects with her mood. Since coming off of Topamax 6 weeks ago she's noticed daily headaches to the frontal and temporal regions nearly everyday.  She would like to be tested for pregnancy given breast enlargement. She's also gotten pregnant on two forms of birth control in the past.  Review of Systems  Constitutional: Negative for fever.  Eyes: Positive for photophobia.  Gastrointestinal: Positive for nausea.  Neurological: Positive for headaches.       Past Medical History:  Diagnosis Date  . Abdominal pain   . Bipolar disorder (HCC)    Bipolar  . Migraines   . UTI (urinary tract infection)      Social History   Socioeconomic History  . Marital status: Single     Spouse name: Not on file  . Number of children: 1  . Years of education: Not on file  . Highest education level: Not on file  Occupational History  . Occupation: Environmental health practitioner  Tobacco Use  . Smoking status: Never Smoker  . Smokeless tobacco: Never Used  Vaping Use  . Vaping Use: Never used  Substance and Sexual Activity  . Alcohol use: Yes    Comment: social   . Drug use: Not Currently  . Sexual activity: Yes    Birth control/protection: I.U.D., Pill  Other Topics Concern  . 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   Lives with her 68 yo daughter, boyfriend.   Social Determinants of Health   Financial Resource Strain:   . Difficulty of Paying Living Expenses: Not on file  Food Insecurity:   . Worried About Programme researcher, broadcasting/film/video in the Last Year: Not on file  . Ran Out of Food in the Last Year: Not on file  Transportation Needs:   . Lack of Transportation (Medical): Not on file  . Lack of Transportation (Non-Medical): Not on file  Physical Activity:   . Days of Exercise per Week: Not on file  . Minutes of Exercise per Session: Not on file  Stress:   . Feeling of Stress : Not on file  Social Connections:   . Frequency of Communication with Friends and Family: Not on file  .  Frequency of Social Gatherings with Friends and Family: Not on file  . Attends Religious Services: Not on file  . Active Member of Clubs or Organizations: Not on file  . Attends Banker Meetings: Not on file  . Marital Status: Not on file  Intimate Partner Violence:   . Fear of Current or Ex-Partner: Not on file  . Emotionally Abused: Not on file  . Physically Abused: Not on file  . Sexually Abused: Not on file    Past Surgical History:  Procedure Laterality Date  . TONSILLECTOMY  2005    Family History  Problem Relation Age of Onset  . Depression  Mother   . Hyperlipidemia Mother   . Depression Father   . Hypertension Father   . ADD / ADHD Father   . Depression Brother   . ADD / ADHD Brother   . Depression Maternal Grandmother   . Diabetes Maternal Grandmother   . Hyperlipidemia Maternal Grandmother   . Hypertension Maternal Grandmother   . Alcohol abuse Maternal Grandfather   . COPD Maternal Grandfather   . Depression Maternal Grandfather   . Depression Paternal Grandmother   . Schizophrenia Paternal Grandmother   . Drug abuse Paternal Grandmother   . Hypertension Paternal Grandmother     No Known Allergies  Current Outpatient Medications on File Prior to Visit  Medication Sig Dispense Refill  . drospirenone-ethinyl estradiol (YASMIN 28) 3-0.03 MG tablet Take 1 tablet by mouth daily. 28 tablet 11  . LamoTRIgine 200 MG TB24 24 hour tablet Take 1 tablet (200 mg total) by mouth at bedtime. 30 tablet 0  . levonorgestrel (MIRENA) 20 MCG/24HR IUD 1 each by Intrauterine route once.     No current facility-administered medications on file prior to visit.    BP 116/76   Pulse 87   Temp (!) 96.6 F (35.9 C) (Temporal)   Ht 5' 1.25" (1.556 m)   Wt 187 lb 4 oz (84.9 kg)   SpO2 98%   BMI 35.09 kg/m    Objective:   Physical Exam Eyes:     Extraocular Movements: Extraocular movements intact.  Cardiovascular:     Rate and Rhythm: Normal rate and regular rhythm.  Neurological:     Mental Status: She is alert and oriented to person, place, and time.     Cranial Nerves: No cranial nerve deficit.            Assessment & Plan:

## 2020-07-22 NOTE — Telephone Encounter (Signed)
It would have been more convenient to take one 450 mg tablet of Wellbutrin XL but since insurance balked I ordered 150 mg to be taken 3 at a time for the same total dose.

## 2020-07-23 LAB — HCG, QUANTITATIVE, PREGNANCY: Quantitative HCG: <0.6 m[IU]/mL

## 2020-07-31 DIAGNOSIS — R519 Headache, unspecified: Secondary | ICD-10-CM

## 2020-08-11 MED ORDER — PROPRANOLOL HCL ER 120 MG PO CP24: 120.0000 mg | ORAL_CAPSULE | Freq: Every day | ORAL | 0 refills | Status: DC

## 2020-08-12 ENCOUNTER — Ambulatory Visit: Payer: 59 | Admitting: Family Medicine

## 2020-08-18 ENCOUNTER — Other Ambulatory Visit (HOSPITAL_COMMUNITY): Payer: Self-pay | Admitting: Psychiatry

## 2020-08-18 MED ORDER — LAMOTRIGINE ER 200 MG PO TB24: 200.0000 mg | ORAL_TABLET | Freq: Every evening | ORAL | 0 refills | Status: DC

## 2020-08-19 ENCOUNTER — Telehealth (INDEPENDENT_AMBULATORY_CARE_PROVIDER_SITE_OTHER): Payer: 59 | Admitting: Psychiatry

## 2020-08-19 ENCOUNTER — Other Ambulatory Visit: Payer: Self-pay

## 2020-08-19 DIAGNOSIS — F3181 Bipolar II disorder: Secondary | ICD-10-CM | POA: Diagnosis not present

## 2020-08-19 DIAGNOSIS — F411 Generalized anxiety disorder: Secondary | ICD-10-CM

## 2020-08-19 MED ORDER — FLUOXETINE HCL 20 MG PO CAPS: 20.0000 mg | ORAL_CAPSULE | Freq: Every day | ORAL | 1 refills | Status: DC

## 2020-08-19 MED ORDER — LAMOTRIGINE ER 200 MG PO TB24: 200.0000 mg | ORAL_TABLET | Freq: Every evening | ORAL | 2 refills | Status: DC

## 2020-08-19 MED ORDER — BUPROPION HCL ER (XL) 300 MG PO TB24: 300.0000 mg | ORAL_TABLET | Freq: Every day | ORAL | 2 refills | Status: DC

## 2020-08-19 NOTE — Progress Notes (Signed)
BH MD/PA/NP OP Progress Note  08/19/2020 11:21 AM Veronica Burgess  MRN:  [id number] Interview was conducted using videoconferencing application and I verified that I was speaking with the correct person using two identifiers. I discussed the limitations of evaluation and management by telemedicine and  the availability of in person appointments. Patient expressed understanding and agreed to proceed. Patient location - home; physician - home office.  Chief Complaint: Anxiety.  HPI: 29 yo single white female who has been followed for bipolar disorder and anxiety by Veronica Burgess at [facility] since April of 2019. Patient's insurance changed and she comes to establish regular psychiatric follow up at [facility] psychiatry. Veronica Burgess reports that her mood problems started after birth of her daughter 6 years ago - she developed post-partum depression, then anxiety, then mood swings. She reports having week long (somethimes longer) periods of increased activity, energy, racing thoughts, decreased need for sleep. During such periods she is more productive, accomplishes more both at work and at home. She tends to clean excessively but denies engaging in high risk behaviors. These periods are typically followed by depressive episodes of similar length. She feels down, unmotivated, tired, focuses poorly and oversleeps. She has multiple episodes (both types) in a year. Additionally she has a tendency to ruminate, worry excessively and this is more pronounced during depressive episodes. She has tried several medications for mood/anxiety: sertraline (became "manic" on it), buspirone, aripiprazole, quetiapine - felt tired and gained weight on both. She is currently on bupropion XL 450 mg and topiramate 100 mg bid as a "mood stabilizer". Interestingly she has never been tried on mood stabilizers - lithium, divalproate, carbamazepine, lamotrigine or oxcarbazepine. She wants to avoid taking medications that  carry risk of weight gain or sedation. We have added Lamictal (her insurance plan covers XR but not regular form) which she now is at 100 mg at HS for a week. She tolerates it well. Mood at this time stable but anxiety is still present. She ruminates and has a tendency to engage in either skin picking (in the past) and more recently chewing her fingernails. She does not reports having panic attacks. Wellkbutrin which she has been on for several months appears to have limited benefit for her anxiety.    Visit Diagnosis:    ICD-10-CM   1. Bipolar 2 disorder (HCC)  F31.81   2. GAD (generalized anxiety disorder)  F41.1     Past Psychiatric History: Please see intake H&P.  Past Medical History:  Past Medical History:  Diagnosis Date  . Abdominal pain   . Bipolar disorder (HCC)    Bipolar  . Migraines   . UTI (urinary tract infection)     Past Surgical History:  Procedure Laterality Date  . TONSILLECTOMY  2005    Family Psychiatric History: Reviewed.  Family History:  Family History  Problem Relation Age of Onset  . Depression Mother   . Hyperlipidemia Mother   . Depression Father   . Hypertension Father   . ADD / ADHD Father   . Depression Brother   . ADD / ADHD Brother   . Depression Maternal Grandmother   . Diabetes Maternal Grandmother   . Hyperlipidemia Maternal Grandmother   . Hypertension Maternal Grandmother   . Alcohol abuse Maternal Grandfather   . COPD Maternal Grandfather   . Depression Maternal Grandfather   . Depression Paternal Grandmother   . Schizophrenia Paternal Grandmother   . Drug abuse Paternal Grandmother   . Hypertension Paternal  Grandmother     Social History:  Social History   Socioeconomic History  . Marital status: Single    Spouse name: Not on file  . Number of children: 1  . Years of education: Not on file  . Highest education level: Not on file  Occupational History  . Occupation: Environmental health practitioner  Tobacco Use  .  Smoking status: Never Smoker  . Smokeless tobacco: Never Used  Vaping Use  . Vaping Use: Never used  Substance and Sexual Activity  . Alcohol use: Yes    Comment: social   . Drug use: Not Currently  . Sexual activity: Yes    Birth control/protection: I.U.D., Pill  Other Topics Concern  . 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   Lives with her 58 yo daughter, boyfriend.   Social Determinants of Health   Financial Resource Strain:   . Difficulty of Paying Living Expenses: Not on file  Food Insecurity:   . Worried About Programme researcher, broadcasting/film/video in the Last Year: Not on file  . Ran Out of Food in the Last Year: Not on file  Transportation Needs:   . Lack of Transportation (Medical): Not on file  . Lack of Transportation (Non-Medical): Not on file  Physical Activity:   . Days of Exercise per Week: Not on file  . Minutes of Exercise per Session: Not on file  Stress:   . Feeling of Stress : Not on file  Social Connections:   . Frequency of Communication with Friends and Family: Not on file  . Frequency of Social Gatherings with Friends and Family: Not on file  . Attends Religious Services: Not on file  . Active Member of Clubs or Organizations: Not on file  . Attends Banker Meetings: Not on file  . Marital Status: Not on file    Allergies: No Known Allergies  Metabolic Disorder Labs: No results found for: HGBA1C, MPG No results found for: PROLACTIN No results found for: CHOL, TRIG, HDL, CHOLHDL, VLDL, LDLCALC Lab Results  Component Value Date   TSH 2.273 01/28/2020    Therapeutic Level Labs: No results found for: LITHIUM No results found for: VALPROATE No components found for:  CBMZ  Current Medications: Current Outpatient Medications  Medication Sig Dispense Refill  . buPROPion (WELLBUTRIN XL) 300 MG 24 hr tablet Take 1 tablet (300 mg total)  by mouth daily. 30 tablet 2  . drospirenone-ethinyl estradiol (YASMIN 28) 3-0.03 MG tablet Take 1 tablet by mouth daily. 28 tablet 11  . FLUoxetine (PROZAC) 20 MG capsule Take 1 capsule (20 mg total) by mouth daily. 30 capsule 1  . LamoTRIgine 200 MG TB24 24 hour tablet Take 1 tablet (200 mg total) by mouth at bedtime. 30 tablet 2  . levonorgestrel (MIRENA) 20 MCG/24HR IUD 1 each by Intrauterine route once.    . propranolol ER (INDERAL LA) 120 MG 24 hr capsule Take 1 capsule (120 mg total) by mouth daily. For headache prevention. 30 capsule 0   No current facility-administered medications for this visit.      Psychiatric Specialty Exam: Review of Systems  Neurological: Positive for headaches.  Psychiatric/Behavioral: The patient is nervous/anxious.   All other systems reviewed and are negative.   There were no vitals taken for this visit.There is no height or weight on file to calculate BMI.  General Appearance: Casual and Well Groomed  Eye  Contact:  Good  Speech:  Clear and Coherent and Normal Rate  Volume:  Normal  Mood:  Anxious  Affect:  Full Range  Thought Process:  Goal Directed and Linear  Orientation:  Full (Time, Place, and Person)  Thought Content: Rumination   Suicidal Thoughts:  No  Homicidal Thoughts:  No  Memory:  Immediate;   Good Recent;   Good Remote;   Good  Judgement:  Good  Insight:  Good  Psychomotor Activity:  Normal  Concentration:  Concentration: Good  Recall:  Good  Fund of Knowledge: Good  Language: Good  Akathisia:  Negative  Handed:  Right  AIMS (if indicated): not done  Assets:  Communication Skills Desire for Improvement Financial Resources/Insurance Housing Physical Health Social Support Talents/Skills  ADL's:  Intact  Cognition: WNL  Sleep:  Good     Assessment and Plan: 29 yo single white female who has been followed for bipolar disorder and anxiety by Veronica Burgess at [facility] since April of 2019.  Patient's insurance changed and she comes to establish regular psychiatric follow up at [facility] psychiatry. Aeriel reports that her mood problems started after birth of her daughter 6 years ago - she developed post-partum depression, then anxiety, then mood swings. She reports having week long (somethimes longer) periods of increased activity, energy, racing thoughts, decreased need for sleep. During such periods she is more productive, accomplishes more both at work and at home. She tends to clean excessively but denies engaging in high risk behaviors. These periods are typically followed by depressive episodes of similar length. She feels down, unmotivated, tired, focuses poorly and oversleeps. She has multiple episodes (both types) in a year. Additionally she has a tendency to ruminate, worry excessively and this is more pronounced during depressive episodes. She has tried several medications for mood/anxiety: sertraline (became "manic" on it), buspirone, aripiprazole, quetiapine - felt tired and gained weight on both. She is currently on bupropion XL 450 mg and topiramate 100 mg bid as a "mood stabilizer". Interestingly she has never been tried on mood stabilizers - lithium, divalproate, carbamazepine, lamotrigine or oxcarbazepine. She wants to avoid taking medications that carry risk of weight gain or sedation. We have added Lamictal (her insurance plan covers XR but not regular form) which she now is at 100 mg at HS for a week. She tolerates it well. Mood at this time stable but anxiety is still present. She ruminates and has a tendency to engage in either skin picking (in the past) and more recently chewing her fingernails. She does not reports having panic attacks. Wellkbutrin which she has been on for several months appears to have limited benefit for her anxiety.  Dx: Bipolar 2 disorder rapid cycling; GAD; susp. Excoriation disorder  Plan: We will continue Lamictal 200 mg at HS. I will continue  Wellbutrin XL but decrease it to 300 mg and instead add fluoxetine 20 mg for anxiety/compulsions. We will watch for emergence of hypomania given hx of such with sertraline.  Next appointment in 6 weeks.The plan was discussed with patient who had an opportunity to ask questions and these were all answered. I spend20minutes in videoconferencingwith the patient.   Magdalene Patricia, MD 08/19/2020, 11:21 AM

## 2020-09-01 DIAGNOSIS — R519 Headache, unspecified: Secondary | ICD-10-CM

## 2020-09-02 MED ORDER — PROPRANOLOL HCL ER 120 MG PO CP24: 120.0000 mg | ORAL_CAPSULE | Freq: Every day | ORAL | 1 refills | Status: DC

## 2020-09-08 ENCOUNTER — Telehealth: Payer: Self-pay | Admitting: Gastroenterology

## 2020-09-08 NOTE — Telephone Encounter (Signed)
Patient called and is ready to schedule her colonoscopy. She saw Dr. Allegra Lai in 03/2020.

## 2020-09-09 NOTE — Telephone Encounter (Signed)
Called patient and made patient a appointment for 09/14/2020 at 1:15. Patient states she will try to go get labs before appointment

## 2020-09-09 NOTE — Telephone Encounter (Signed)
I saw her only once on video visit. She didn't get labs that I recommended She was supposed to see me as follow up, please schedule and ok to overbook  RV

## 2020-09-09 NOTE — Telephone Encounter (Signed)
Why does patient need a colonoscopy

## 2020-09-14 ENCOUNTER — Telehealth: Payer: Self-pay | Admitting: *Deleted

## 2020-09-14 DIAGNOSIS — G43709 Chronic migraine without aura, not intractable, without status migrainosus: Secondary | ICD-10-CM

## 2020-09-14 MED ORDER — SUMATRIPTAN SUCCINATE 50 MG PO TABS: ORAL_TABLET | ORAL | 0 refills | Status: DC

## 2020-09-14 NOTE — Telephone Encounter (Signed)
Please ask patient how she is doing on the propanolol with the dose increased to 120 mg? I will send in a prescription for sumatriptan (Imitrex) 50 mg.  Take 1 tablet at migraine onset, may repeat 2 hours later with another tablet if migraine persists.

## 2020-09-14 NOTE — Telephone Encounter (Addendum)
Patient states she has had a migraine since yesterday, even on the Propranolol.  She states that OTC meds have knocked it back to a dull ache instead of a stabbing pain but it won't go away.  Patient asks if she could get a medication sent to [place], [facility], [place]?

## 2020-09-14 NOTE — Telephone Encounter (Signed)
FYI Patient states that increase in Propanolol was helping a lot until she got this migraine yesterday. Informed she directions on Imitrex and had repeat back to me. Let her know has been called in . Will call if any questions.

## 2020-09-16 ENCOUNTER — Ambulatory Visit: Payer: 59 | Admitting: Gastroenterology

## 2020-09-21 ENCOUNTER — Encounter (HOSPITAL_COMMUNITY): Payer: Self-pay | Admitting: Emergency Medicine

## 2020-09-21 ENCOUNTER — Other Ambulatory Visit: Payer: Self-pay

## 2020-09-21 ENCOUNTER — Ambulatory Visit (HOSPITAL_COMMUNITY)
Admission: EM | Admit: 2020-09-21 | Discharge: 2020-09-21 | Disposition: A | Discharge status: Home / Self Care | Payer: 59 | Attending: Internal Medicine | Admitting: Internal Medicine

## 2020-09-21 DIAGNOSIS — M549 Dorsalgia, unspecified: Secondary | ICD-10-CM

## 2020-09-21 MED ORDER — KETOROLAC TROMETHAMINE 30 MG/ML IJ SOLN
30.0000 mg | Freq: Once | INTRAMUSCULAR | Status: AC
  Administered 2020-09-21: 30 mg via INTRAMUSCULAR

## 2020-09-21 MED ORDER — KETOROLAC TROMETHAMINE 30 MG/ML IJ SOLN
INTRAMUSCULAR | Status: AC
  Filled 2020-09-21: qty 1

## 2020-09-21 NOTE — ED Triage Notes (Signed)
Pt c/o diarrhea x 3 weeks and left side flank pain that began last night. Pt states pain radiates to her side. Pt states one day last week her urine was dark but it subsided naturally.

## 2020-09-21 NOTE — Discharge Instructions (Signed)
Please take medication as tolerated. Return to urgent care if symptoms persist.

## 2020-09-23 ENCOUNTER — Other Ambulatory Visit: Payer: Self-pay

## 2020-09-23 ENCOUNTER — Encounter: Payer: Self-pay | Admitting: Primary Care

## 2020-09-23 ENCOUNTER — Ambulatory Visit (INDEPENDENT_AMBULATORY_CARE_PROVIDER_SITE_OTHER): Payer: 59 | Admitting: Primary Care

## 2020-09-23 VITALS — BP 124/62 | HR 98 | Temp 97.6°F | Ht 61.25 in | Wt 185.0 lb

## 2020-09-23 DIAGNOSIS — R5383 Other fatigue: Secondary | ICD-10-CM | POA: Insufficient documentation

## 2020-09-23 DIAGNOSIS — G8929 Other chronic pain: Secondary | ICD-10-CM | POA: Diagnosis not present

## 2020-09-23 DIAGNOSIS — R109 Unspecified abdominal pain: Secondary | ICD-10-CM | POA: Diagnosis not present

## 2020-09-23 NOTE — Progress Notes (Signed)
Subjective:    Patient ID: Veronica Burgess, female    DOB: 30-May-1991, 30 y.o.   MRN: [id number]  HPI  This visit occurred during the SARS-CoV-2 public health emergency.  Safety protocols were in place, including screening questions prior to the visit, additional usage of staff PPE, and extensive cleaning of exam room while observing appropriate contact time as indicated for disinfecting solutions.   Veronica Burgess is a 29 year old female with a history of chronic abdominal pain, migraines, GAD who presents today with a chief complaint of fatigue and for urgent care follow-up.  She presented to the urgent care on 09/21/2020 with a chief complaint of diarrhea x3 weeks with left-sided flank pain that began the night prior with radiation to her left groin.  Exam was more consistent for musculoskeletal etiology, she was provided with IM Toradol 30 mg and conservative treatment recommendations.  Today she endorses that her pain from 09/21/2020 resolved within a few hours after the Toradol injection.  All abdominal symptoms have since resolved.    She continues to experience her chronic abdominal symptoms which include pain, alternating diarrhea with constipation.  She is following with GI, unfortunately unable to complete labs that were requested earlier this year.  She has an appointment scheduled with GI for January 2022.  Today she is mostly concerned about fatigue which is chronic, but worse over the last 2 to 3 weeks.  She has a family history of thyroid disease in her grandmother.  Review of Systems  Constitutional: Positive for fatigue. Negative for chills and fever.  Gastrointestinal: Positive for abdominal pain, constipation and diarrhea. Negative for blood in stool, nausea and vomiting.       See HPI       Past Medical History:  Diagnosis Date  . Abdominal pain   . Bipolar disorder (HCC)    Bipolar  . Migraines   . UTI (urinary tract infection)      Social History   Socioeconomic  History  . Marital status: Single    Spouse name: Not on file  . Number of children: 1  . Years of education: Not on file  . Highest education level: Not on file  Occupational History  . Occupation: Environmental health practitioner  Tobacco Use  . Smoking status: Never Smoker  . Smokeless tobacco: Never Used  Vaping Use  . Vaping Use: Never used  Substance and Sexual Activity  . Alcohol use: Yes    Comment: social   . Drug use: Not Currently  . Sexual activity: Yes    Birth control/protection: I.U.D., Pill  Other Topics Concern  . 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   Lives with her 25 yo daughter, boyfriend.   Social Determinants of Health   Financial Resource Strain:   . Difficulty of Paying Living Expenses: Not on file  Food Insecurity:   . Worried About Programme researcher, broadcasting/film/video in the Last Year: Not on file  . Ran Out of Food in the Last Year: Not on file  Transportation Needs:   . Lack of Transportation (Medical): Not on file  . Lack of Transportation (Non-Medical): Not on file  Physical Activity:   . Days of Exercise per Week: Not on file  . Minutes of Exercise per Session: Not on file  Stress:   . Feeling of Stress : Not on file  Social Connections:   .  Frequency of Communication with Friends and Family: Not on file  . Frequency of Social Gatherings with Friends and Family: Not on file  . Attends Religious Services: Not on file  . Active Member of Clubs or Organizations: Not on file  . Attends Banker Meetings: Not on file  . Marital Status: Not on file  Intimate Partner Violence:   . Fear of Current or Ex-Partner: Not on file  . Emotionally Abused: Not on file  . Physically Abused: Not on file  . Sexually Abused: Not on file    Past Surgical History:  Procedure Laterality Date  . TONSILLECTOMY  2005    Family History  Problem  Relation Age of Onset  . Depression Mother   . Hyperlipidemia Mother   . Thyroid disease Mother   . Depression Father   . Hypertension Father   . ADD / ADHD Father   . Depression Brother   . ADD / ADHD Brother   . Depression Maternal Grandmother   . Diabetes Maternal Grandmother   . Hyperlipidemia Maternal Grandmother   . Hypertension Maternal Grandmother   . Thyroid disease Maternal Grandmother   . Alcohol abuse Maternal Grandfather   . COPD Maternal Grandfather   . Depression Maternal Grandfather   . Depression Paternal Grandmother   . Schizophrenia Paternal Grandmother   . Drug abuse Paternal Grandmother   . Hypertension Paternal Grandmother     No Known Allergies  Current Outpatient Medications on File Prior to Visit  Medication Sig Dispense Refill  . buPROPion (WELLBUTRIN XL) 300 MG 24 hr tablet Take 1 tablet (300 mg total) by mouth daily. 30 tablet 2  . drospirenone-ethinyl estradiol (YASMIN 28) 3-0.03 MG tablet Take 1 tablet by mouth daily. 28 tablet 11  . LamoTRIgine 200 MG TB24 24 hour tablet Take 1 tablet (200 mg total) by mouth at bedtime. 30 tablet 2  . levonorgestrel (MIRENA) 20 MCG/24HR IUD 1 each by Intrauterine route once.    . propranolol ER (INDERAL LA) 120 MG 24 hr capsule Take 1 capsule (120 mg total) by mouth daily. For headache prevention. 90 capsule 1  . SUMAtriptan (IMITREX) 50 MG tablet Take 1 tablet by mouth at migraine onset, may repeat in 2 hours if headache persists or recurs. 10 tablet 0  . [DISCONTINUED] FLUoxetine (PROZAC) 20 MG capsule Take 1 capsule (20 mg total) by mouth daily. 30 capsule 1   No current facility-administered medications on file prior to visit.    BP 124/62   Pulse 98   Temp 97.6 F (36.4 C) (Temporal)   Ht 5' 1.25" (1.556 m)   Wt 185 lb (83.9 kg)   SpO2 98%   BMI 34.67 kg/m    Objective:   Physical Exam Pulmonary:     Effort: Pulmonary effort is normal.  Abdominal:     General: Abdomen is flat.     Palpations:  Abdomen is soft.     Tenderness: There is abdominal tenderness in the right lower quadrant, periumbilical area, suprapubic area and left lower quadrant. There is no guarding.    Neurological:     Mental Status: She is alert.            Assessment & Plan:

## 2020-09-23 NOTE — Patient Instructions (Signed)
Stop by the lab prior to leaving today. I will notify you of your results once received.   Please keep appointment with GI, We will follow up with you as well.  Ensure you are consuming 64 ounces of water daily.    Bland Diet A bland diet consists of foods that are often soft and do not have a lot of fat, fiber, or extra seasonings. Foods without fat, fiber, or seasoning are easier for the body to digest. They are also less likely to irritate your mouth, throat, stomach, and other parts of your digestive system. A bland diet is sometimes called a BRAT diet. What is my plan? Your health care provider or food and nutrition specialist (dietitian) may recommend specific changes to your diet to prevent symptoms or to treat your symptoms. These changes may include:  Eating small meals often.  Cooking food until it is soft enough to chew easily.  Chewing your food well.  Drinking fluids slowly.  Not eating foods that are very spicy, sour, or fatty.  Not eating citrus fruits, such as oranges and grapefruit. What do I need to know about this diet?  Eat a variety of foods from the bland diet food list.  Do not follow a bland diet longer than needed.  Ask your health care provider whether you should take vitamins or supplements. What foods can I eat? Grains  Hot cereals, such as cream of wheat. Rice. Bread, crackers, or tortillas made from refined white flour. Vegetables Canned or cooked vegetables. Mashed or boiled potatoes. Fruits  Bananas. Applesauce. Other types of cooked or canned fruit with the skin and seeds removed, such as canned peaches or pears. Meats and other proteins  Scrambled eggs. Creamy peanut butter or other nut butters. Lean, well-cooked meats, such as chicken or fish. Tofu. Soups or broths. Dairy Low-fat dairy products, such as milk, cottage cheese, or yogurt. Beverages  Water. Herbal tea. Apple juice. Fats and oils Mild salad dressings. Canola or olive  oil. Sweets and desserts Pudding. Custard. Fruit gelatin. Ice cream. The items listed above may not be a complete list of recommended foods and beverages. Contact a dietitian for more options. What foods are not recommended? Grains Whole grain breads and cereals. Vegetables Raw vegetables. Fruits Raw fruits, especially citrus, berries, or dried fruits. Dairy Whole fat dairy foods. Beverages Caffeinated drinks. Alcohol. Seasonings and condiments Strongly flavored seasonings or condiments. Hot sauce. Salsa. Other foods Spicy foods. Fried foods. Sour foods, such as pickled or fermented foods. Foods with high sugar content. Foods high in fiber. The items listed above may not be a complete list of foods and beverages to avoid. Contact a dietitian for more information. Summary  A bland diet consists of foods that are often soft and do not have a lot of fat, fiber, or extra seasonings.  Foods without fat, fiber, or seasoning are easier for the body to digest.  Check with your health care provider to see how long you should follow this diet plan. It is not meant to be followed for long periods. This information is not intended to replace advice given to you by your health care provider. Make sure you discuss any questions you have with your health care provider. Document Revised: 12/13/2017 Document Reviewed: 12/13/2017 Elsevier Patient Education  2020 [employer].

## 2020-09-23 NOTE — Assessment & Plan Note (Addendum)
Chronic and reoccurring. Has seen GI recently and follow up appointment scheduled.   CBC, H pylori & GI panel pending. Will share GI labs with GI. Await results.   Agree with assessment and plan. Doreene Nest, NP

## 2020-09-23 NOTE — ED Provider Notes (Signed)
[facility]    CSN: [id number] Arrival date & time: 09/21/20  6073      History   Chief Complaint Chief Complaint  Patient presents with  . Flank Pain  . Diarrhea    HPI Veronica Burgess is a 29 y.o. female with a history of chronic diarrhea comes to the urgent care with 3 week history of diarrhea with associated left-sided flank pain.  Left-sided flank pain started last night and radiates to the left groin.  Pain is aggravated by movement.  No known relieving factors.  No known relieving factors.  No fever or chills.  No dysuria urgency.   No falls, lifting or heavy lifting. HPI  Past Medical History:  Diagnosis Date  . Abdominal pain   . Bipolar disorder (HCC)    Bipolar  . Migraines   . UTI (urinary tract infection)     Patient Active Problem List   Diagnosis Date Noted  . 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 Chronic abdominal pain 02/03/2020  . Rectal bleeding 02/03/2020  . Bipolar 2 disorder (HCC) 02/03/2020  . Migraines 02/03/2020  . Bilateral hand pain 02/03/2020  . GAD (generalized anxiety disorder) 02/13/2018    Past Surgical History:  Procedure Laterality Date  . TONSILLECTOMY  2005    OB History    Gravida  1   Para  1   Term  1   Preterm      AB      Living  1     SAB      TAB      Ectopic      Multiple      Live Births  1            Home Medications    Prior to Admission medications   Medication Sig Start Date End Date Taking? Authorizing Provider  buPROPion (WELLBUTRIN XL) 300 MG 24 hr tablet Take 1 tablet (300 mg total) by mouth daily. 08/19/20 11/17/20 Yes Pucilowski, Olgierd A, MD  drospirenone-ethinyl estradiol (YASMIN 28) 3-0.03 MG tablet Take 1 tablet by mouth daily. 05/12/20  Yes Anyanwu, Jethro Bastos, MD  LamoTRIgine 200 MG TB24 24 hour tablet Take 1 tablet (200 mg total) by mouth at bedtime. 08/19/20 11/17/20 Yes Pucilowski, Roosvelt Maser, MD  levonorgestrel (MIRENA) 20  MCG/24HR IUD 1 each by Intrauterine route once. 02/01/17  Yes [provider]  propranolol ER (INDERAL LA) 120 MG 24 hr capsule Take 1 capsule (120 mg total) by mouth daily. For headache prevention. 09/02/20  Yes Doreene Nest, NP  SUMAtriptan (IMITREX) 50 MG tablet Take 1 tablet by mouth at migraine onset, may repeat in 2 hours if headache persists or recurs. 09/14/20  Yes Doreene Nest, NP  FLUoxetine (PROZAC) 20 MG capsule Take 1 capsule (20 mg total) by mouth daily. 08/19/20 09/21/20  Pucilowski, Roosvelt Maser, MD    Family History Family History  Problem Relation Age of Onset  . Depression Mother   . Hyperlipidemia Mother   . Depression Father   . Hypertension Father   . ADD / ADHD Father   . Depression Brother   . ADD / ADHD Brother   . Depression Maternal Grandmother   . Diabetes Maternal Grandmother   . Hyperlipidemia Maternal Grandmother   . Hypertension Maternal Grandmother   . Alcohol abuse Maternal Grandfather   . COPD Maternal Grandfather   . Depression Maternal Grandfather   . Depression Paternal  Grandmother   . Schizophrenia Paternal Grandmother   . Drug abuse Paternal Grandmother   . Hypertension Paternal Grandmother     Social History Social History   Tobacco Use  . Smoking status: Never Smoker  . Smokeless tobacco: Never Used  Vaping Use  . Vaping Use: Never used  Substance Use Topics  . Alcohol use: Yes    Comment: social   . Drug use: Not Currently     Allergies   Patient has no known allergies.   Review of Systems Review of Systems  Genitourinary: Negative for difficulty urinating, dysuria, frequency and urgency.  Musculoskeletal: Positive for back pain and myalgias. Negative for arthralgias, gait problem and joint swelling.     Physical Exam Triage Vital Signs ED Triage Vitals  Enc Vitals Group     BP 09/21/20 1050 120/69     Pulse Rate 09/21/20 1050 72     Resp 09/21/20 1050 19     Temp 09/21/20 1050 98.4 F (36.9 C)      Temp Source 09/21/20 1050 Oral     SpO2 --      Weight --      Height --      Head Circumference --      Peak Flow --      Pain Score 09/21/20 1046 8     Pain Loc --      Pain Edu? --      Excl. in GC? --    No data found.  Updated Vital Signs BP 120/69 (BP Location: Left Arm)   Pulse 72   Temp 98.4 F (36.9 C) (Oral)   Resp 19   Visual Acuity Right Eye Distance:   Left Eye Distance:   Bilateral Distance:    Right Eye Near:   Left Eye Near:    Bilateral Near:     Physical Exam Vitals and nursing note reviewed.  Constitutional:      General: She is not in acute distress.    Appearance: She is not ill-appearing.  Cardiovascular:     Rate and Rhythm: Normal rate and regular rhythm.  Musculoskeletal:        General: Tenderness present. Normal range of motion.     Comments: Tenderness over the right thoracic paraspinal muscle.  Full range of motion of the vertebral spine.  Neurological:     Mental Status: She is alert.      UC Treatments / Results  Labs (all labs ordered are listed, but only abnormal results are displayed) Labs Reviewed - No data to display  EKG   Radiology No results found.  Procedures Procedures (including critical care time)  Medications Ordered in UC Medications  ketorolac (TORADOL) 30 MG/ML injection 30 mg (30 mg Intramuscular Given 09/21/20 1122)    Initial Impression / Assessment and Plan / UC Course  I have reviewed the triage vital signs and the nursing notes.  Pertinent labs & imaging results that were available during my care of the patient were reviewed by me and considered in my medical decision making (see chart for details).     1.  Musculoskeletal pain: Toradol 30 mg IM x1 dose Over-the-counter ibuprofen prescription Gentle range of motion exercises Heat therapy Return precautions given  Final Clinical Impressions(s) / UC Diagnoses   Final diagnoses:  Musculoskeletal back pain     Discharge Instructions      Please take medication as tolerated. Return to urgent care if symptoms persist.    ED Prescriptions  None     PDMP not reviewed this encounter.   Merrilee Jansky, MD 09/23/20 1234

## 2020-09-23 NOTE — Progress Notes (Signed)
   Subjective:    Patient ID: [name], female    DOB: 11-04-1991, 29 y.o.   MRN: [id number]  HPI  This visit occurred during the SARS-CoV-2 public health emergency.  Safety protocols were in place, including screening questions prior to the visit, additional usage of staff PPE, and extensive cleaning of exam room while observing appropriate contact time as indicated for disinfecting solutions.   Ms. Hargrove is a 29 year old female patient with a history of migraines, rectal bleeding, chronic abdominal pain, bipolar 2 disorder & GAD who present today with chief complaint of stomach issues & low energy.   She had a sudden onset of left sided thoracic back pain that began on Sunday night, she went into urgent care on Monday for this pain as well as diarrhea. She was given a shot of Toradol which resolved the back pain. She did have some soreness in lower abdomen which occurred last yesterday. Currently no pain today. No diarrhea in last 24 hours.This is a normal cycle for her to get generalized abdominal pain for 2-3 days and then resolves.   She was able to see GI 5/21, At that time they ordered an H. Pylori & GI profile. Unable to have these tests done yet. She also has Recent Pelvic US done by GYN 6/21: Unremarkable, IUD in correct position.  Denies any fever, nausea or vomiting.   She is also concerned about her energy level, she feels very tired. Going to bed early and sleepy when she wakes up. This has been progressing over the last 2-3 weeks. Her skin very dry. She is concerned due to mother and grandmother having tyroid issues.    BP Readings from Last 3 Encounters:  09/23/20 124/62  09/21/20 120/69  07/22/20 116/76     Review of Systems  Constitutional: Negative.   Eyes: Negative.   Respiratory: Negative.  Negative for chest tightness and shortness of breath.   Cardiovascular: Negative.  Negative for chest pain and palpitations.  Endocrine: Negative.  Negative for cold  intolerance and heat intolerance.  Genitourinary: Negative.   Musculoskeletal: Positive for back pain. Negative for gait problem and myalgias.  Skin: Negative for pallor and rash.       Reports very dry skin  Neurological: Positive for headaches. Negative for light-headedness and numbness.       Headaches are much better but reports 1-2 migraines per month.   Psychiatric/Behavioral: Negative.        Objective:   Physical Exam Constitutional:      Appearance: Normal appearance.  Cardiovascular:     Rate and Rhythm: Normal rate and regular rhythm.     Pulses: Normal pulses.     Heart sounds: Normal heart sounds.  Pulmonary:     Effort: Pulmonary effort is normal.     Breath sounds: Normal breath sounds.  Musculoskeletal:        General: Normal range of motion.  Skin:    Capillary Refill: Capillary refill takes less than 2 seconds.  Neurological:     General: No focal deficit present.     Mental Status: She is alert and oriented to person, place, and time.           Assessment & Plan:

## 2020-09-23 NOTE — Assessment & Plan Note (Addendum)
Ongoing and progressing over last 1 month.   Given history of thyroid disease in mother & grandmother will rule this out.  Will also rule out anemia, B12 deficiency & vitamin d deficiency.   Labs Pending .  Agree with assessment and plan. Doreene Nest, NP

## 2020-09-24 ENCOUNTER — Other Ambulatory Visit: Payer: Self-pay

## 2020-09-24 ENCOUNTER — Other Ambulatory Visit: Payer: Self-pay | Admitting: Primary Care

## 2020-09-24 DIAGNOSIS — E538 Deficiency of other specified B group vitamins: Secondary | ICD-10-CM

## 2020-09-24 DIAGNOSIS — R5383 Other fatigue: Secondary | ICD-10-CM

## 2020-09-24 DIAGNOSIS — E559 Vitamin D deficiency, unspecified: Secondary | ICD-10-CM

## 2020-09-24 LAB — CBC
HCT: 38 % (ref 36.0–46.0)
Hemoglobin: 12.7 g/dL (ref 12.0–15.0)
MCHC: 33.6 g/dL (ref 30.0–36.0)
MCV: 88.1 fl (ref 78.0–100.0)
Platelets: 239 10*3/uL (ref 150.0–400.0)
RBC: 4.31 Mil/uL (ref 3.87–5.11)
RDW: 13.5 % (ref 11.5–15.5)
WBC: 6.5 10*3/uL (ref 4.0–10.5)

## 2020-09-24 LAB — TSH: TSH: 3.09 u[IU]/mL (ref 0.35–4.50)

## 2020-09-24 LAB — VITAMIN D 25 HYDROXY (VIT D DEFICIENCY, FRACTURES): VITD: 16.49 ng/mL — ABNORMAL LOW (ref 30.00–100.00)

## 2020-09-24 LAB — VITAMIN B12: Vitamin B-12: 221 pg/mL (ref 211–911)

## 2020-09-24 MED ORDER — VITAMIN D (ERGOCALCIFEROL) 1.25 MG (50000 UNIT) PO CAPS: ORAL_CAPSULE | ORAL | 0 refills | Status: DC

## 2020-09-25 ENCOUNTER — Other Ambulatory Visit (INDEPENDENT_AMBULATORY_CARE_PROVIDER_SITE_OTHER): Payer: 59

## 2020-09-25 DIAGNOSIS — R109 Unspecified abdominal pain: Secondary | ICD-10-CM | POA: Diagnosis not present

## 2020-09-25 DIAGNOSIS — R5383 Other fatigue: Secondary | ICD-10-CM

## 2020-09-25 DIAGNOSIS — G8929 Other chronic pain: Secondary | ICD-10-CM

## 2020-09-28 ENCOUNTER — Telehealth: Payer: Self-pay | Admitting: Primary Care

## 2020-09-28 NOTE — Telephone Encounter (Signed)
Pt called in wanted to know about the results of the labs she dropped off on Friday

## 2020-09-28 NOTE — Telephone Encounter (Signed)
Please notify patient that those labs take several days to return, should be in by tomorrow. We will be sure to notify her as soon as we receive and review results.

## 2020-09-28 NOTE — Telephone Encounter (Signed)
Called let her know she did not have any questions at this time.

## 2020-09-29 ENCOUNTER — Telehealth (INDEPENDENT_AMBULATORY_CARE_PROVIDER_SITE_OTHER): Payer: 59 | Admitting: Psychiatry

## 2020-09-29 ENCOUNTER — Other Ambulatory Visit: Payer: Self-pay

## 2020-09-29 DIAGNOSIS — F411 Generalized anxiety disorder: Secondary | ICD-10-CM

## 2020-09-29 DIAGNOSIS — F3181 Bipolar II disorder: Secondary | ICD-10-CM | POA: Diagnosis not present

## 2020-09-29 MED ORDER — LAMOTRIGINE ER 200 MG PO TB24: 200.0000 mg | ORAL_TABLET | Freq: Every day | ORAL | 1 refills | Status: DC

## 2020-09-29 MED ORDER — BUPROPION HCL ER (XL) 300 MG PO TB24: 300.0000 mg | ORAL_TABLET | Freq: Every day | ORAL | 1 refills | Status: DC

## 2020-09-29 NOTE — Progress Notes (Signed)
[facility] MD/PA/NP OP Progress Note  09/29/2020 11:20 AM Veronica Burgess  MRN:  [id number] Interview was conducted using videoconferencing application and I verified that I was speaking with the correct person using two identifiers. I discussed the limitations of evaluation and management by telemedicine and  the availability of in person appointments. Patient expressed understanding and agreed to proceed. Patient location - home; physician - home office.  Chief Complaint: Fatigue.  HPI: 29 yo single white female who has been followed for bipolar disorder and anxiety by Barbette Merino at [facility] since April of 2019. Patient's insurance changed and she comes to establish regular psychiatric follow up at [facility] psychiatry. Veronica Burgess reports that her mood problems started after birth of her daughter 6 years ago - she developed post-partum depression, then anxiety, then mood swings. She reports having week long (somethimes longer) periods of increased activity, energy, racing thoughts, decreased need for sleep. During such periods she is more productive, accomplishes more both at work and at home. She tends to clean excessively but denies engaging in high risk behaviors. These periods are typically followed by depressive episodes of similar length. She feels down, unmotivated, tired, focuses poorly and oversleeps. She has multiple episodes (both types) in a year. Additionally she has a tendency to ruminate, worry excessively and this is more pronounced during depressive episodes. She has tried several medications for mood/anxiety: sertraline (became "manic" on it), buspirone, aripiprazole, quetiapine - felt tired and gained weight on both. She is currently on bupropion XL 450 mg and topiramate 100 mg bid as a "mood stabilizer". Interestingly she has never been tried on mood stabilizers - lithium, divalproate, carbamazepine, lamotrigine or oxcarbazepine. She wants to avoid taking medications that  Veronica risk of weight gain or sedation.We have added Lamictal (her insurance plan covers XR but not regular form) which she now is at 200 mg at HS. Mood at this time stable but anxiety is still present. She ruminates and has a tendency to engage in either skin picking (in the past) and more recently chewing her fingernails. She does not reports having panic attacks. Wellkbutrin which she has been on for several months appears to have limited benefit for her anxiety. We have added fluoxetine 20 mg but her chronic fatigue worsened and she stopped taking it. Anxiety at this time is fairly low and she is not picking her skin.    Visit Diagnosis:    ICD-10-CM   1. Bipolar 2 disorder (HCC)  F31.81   2. GAD (generalized anxiety disorder)  F41.1     Past Psychiatric History: Please see intake H&P.  Past Medical History:  Past Medical History:  Diagnosis Date  . Abdominal pain   . Bipolar disorder (HCC)    Bipolar  . Migraines   . UTI (urinary tract infection)     Past Surgical History:  Procedure Laterality Date  . TONSILLECTOMY  2005    Family Psychiatric History: Reviewed.  Family History:  Family History  Problem Relation Age of Onset  . Depression Mother   . Hyperlipidemia Mother   . Thyroid disease Mother   . Depression Father   . Hypertension Father   . ADD / ADHD Father   . Depression Brother   . ADD / ADHD Brother   . Depression Maternal Grandmother   . Diabetes Maternal Grandmother   . Hyperlipidemia Maternal Grandmother   . Hypertension Maternal Grandmother   . Thyroid disease Maternal Grandmother   . Alcohol abuse Maternal Grandfather   .  COPD Maternal Grandfather   . Depression Maternal Grandfather   . Depression Paternal Grandmother   . Schizophrenia Paternal Grandmother   . Drug abuse Paternal Grandmother   . Hypertension Paternal Grandmother     Social History:  Social History   Socioeconomic History  . Marital status: Single    Spouse name: Not on  file  . Number of children: 1  . Years of education: Not on file  . Highest education level: Not on file  Occupational History  . Occupation: Environmental health practitioner  Tobacco Use  . Smoking status: Never Smoker  . Smokeless tobacco: Never Used  Vaping Use  . Vaping Use: Never used  Substance and Sexual Activity  . Alcohol use: Yes    Comment: social   . Drug use: Not Currently  . Sexual activity: Yes    Birth control/protection: I.U.D., Pill  Other Topics Concern  . 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   Lives with her 66 yo daughter, boyfriend.   Social Determinants of Health   Financial Resource Strain:   . Difficulty of Paying Living Expenses: Not on file  Food Insecurity:   . Worried About Programme researcher, broadcasting/film/video in the Last Year: Not on file  . Ran Out of Food in the Last Year: Not on file  Transportation Needs:   . Lack of Transportation (Medical): Not on file  . Lack of Transportation (Non-Medical): Not on file  Physical Activity:   . Days of Exercise per Week: Not on file  . Minutes of Exercise per Session: Not on file  Stress:   . Feeling of Stress : Not on file  Social Connections:   . Frequency of Communication with Friends and Family: Not on file  . Frequency of Social Gatherings with Friends and Family: Not on file  . Attends Religious Services: Not on file  . Active Member of Clubs or Organizations: Not on file  . Attends Banker Meetings: Not on file  . Marital Status: Not on file    Allergies: No Known Allergies  Metabolic Disorder Labs: No results found for: HGBA1C, MPG No results found for: PROLACTIN No results found for: CHOL, TRIG, HDL, CHOLHDL, VLDL, LDLCALC Lab Results  Component Value Date   TSH 3.09 09/23/2020   TSH 2.273 01/28/2020    Therapeutic Level Labs: No results found for: LITHIUM No results found  for: VALPROATE No components found for:  CBMZ  Current Medications: Current Outpatient Medications  Medication Sig Dispense Refill  . buPROPion (WELLBUTRIN XL) 300 MG 24 hr tablet Take 1 tablet (300 mg total) by mouth daily. 90 tablet 1  . drospirenone-ethinyl estradiol (YASMIN 28) 3-0.03 MG tablet Take 1 tablet by mouth daily. 28 tablet 11  . LamoTRIgine 200 MG TB24 24 hour tablet Take 1 tablet (200 mg total) by mouth at bedtime. 90 tablet 1  . levonorgestrel (MIRENA) 20 MCG/24HR IUD 1 each by Intrauterine route once.    . propranolol ER (INDERAL LA) 120 MG 24 hr capsule Take 1 capsule (120 mg total) by mouth daily. For headache prevention. 90 capsule 1  . SUMAtriptan (IMITREX) 50 MG tablet Take 1 tablet by mouth at migraine onset, may repeat in 2 hours if headache persists or recurs. 10 tablet 0  . Vitamin D, Ergocalciferol, (DRISDOL) 1.25 MG (50000 UNIT) CAPS capsule Take 1 capsule by mouth once weekly for 12 weeks.  12 capsule 0   No current facility-administered medications for this visit.      Psychiatric Specialty Exam: Review of Systems  Constitutional: Positive for fatigue.  All other systems reviewed and are negative.   There were no vitals taken for this visit.There is no height or weight on file to calculate BMI.  General Appearance: Casual and Well Groomed  Eye Contact:  Good  Speech:  Clear and Coherent and Normal Rate  Volume:  Normal  Mood:  Euthymic  Affect:  Full Range  Thought Process:  Goal Directed  Orientation:  Full (Time, Place, and Person)  Thought Content: Logical   Suicidal Thoughts:  No  Homicidal Thoughts:  No  Memory:  Immediate;   Good Recent;   Good Remote;   Good  Judgement:  Good  Insight:  Good  Psychomotor Activity:  Normal  Concentration:  Concentration: Good  Recall:  Good  Fund of Knowledge: Good  Language: Good  Akathisia:  Negative  Handed:  Right  AIMS (if indicated): not done  Assets:  Communication Skills Desire for  Improvement Financial Resources/Insurance Housing Resilience Social Support  ADL's:  Intact  Cognition: WNL  Sleep:  Good     Assessment and Plan: 29 yo single white female who has been followed for bipolar disorder and anxiety by Barbette Merinoarolyn McDonald [facility] since April of 2019. Patient's insurance changed and she now established regular psychiatric follow [facility]. Lowella Bandyikki reports that her mood problems started after birth of her daughter 6 years ago - she developed post-partum depression, then anxiety, then mood swings. She reports having week long (somethimes longer) periods of increased activity, energy, racing thoughts, decreased need for sleep. During such periods she is more productive, accomplishes more both at work and at home. She tends to clean excessively but denies engaging in high risk behaviors. These periods are typically followed by depressive episodes of similar length. She feels down, unmotivated, tired, focuses poorly and oversleeps. She has multiple episodes (both types) in a year. Additionally she has a tendency to ruminate, worry excessively and this is more pronounced during depressive episodes. She has tried several medications for mood/anxiety: sertraline (became "manic" on it), buspirone, aripiprazole, quetiapine - felt tired and gained weight on both. She is currently on bupropion XL 450 mg and topiramate 100 mg bid as a "mood stabilizer". Interestingly she has never been tried on mood stabilizers - lithium, divalproate, carbamazepine, lamotrigine or oxcarbazepine. She wants to avoid taking medications that Veronica risk of weight gain or sedation.We have added Lamictal (her insurance plan covers XR but not regular form) which she now is at 200 mg at HS. Mood at this time stable but anxiety is still present. She ruminates and has a tendency to engage in either skin picking (in the past) and more recently chewing her fingernails. She does not  reports having panic attacks. Wellkbutrin which she has been on for several months appears to have limited benefit for her anxiety. We have added fluoxetine 20 mg but her chronic fatigue worsened and she stopped taking it. Anxiety at this time is fairly low and she is not picking her skin.  Dx: Bipolar 2 disorder rapid cycling; GAD  Plan: We willcontinueLamictal ER 200 mg at HS, Wellbutrin XL 300 mg. Next appointment in 3 months.The plan was discussed with patient who had an opportunity to ask questions and these were all answered. I spend2015minutes in videoconferencingwith the patient.   Magdalene Patricialgierd A Milia Warth, MD  09/29/2020, 11:20 AM

## 2020-09-30 NOTE — Telephone Encounter (Signed)
Added to open message.  

## 2020-10-01 NOTE — Telephone Encounter (Signed)
Called patient let know that results have not been received. Will call once they come in and are reviewed.

## 2020-10-01 NOTE — Telephone Encounter (Signed)
Pt calling to get test results from stool sample  Best number [phone]

## 2020-10-07 DIAGNOSIS — R197 Diarrhea, unspecified: Secondary | ICD-10-CM

## 2020-10-07 DIAGNOSIS — G8929 Other chronic pain: Secondary | ICD-10-CM

## 2020-10-08 ENCOUNTER — Other Ambulatory Visit: Payer: Self-pay

## 2020-10-08 ENCOUNTER — Ambulatory Visit
Admission: EM | Admit: 2020-10-08 | Discharge: 2020-10-08 | Disposition: A | Discharge status: Home / Self Care | Payer: 59 | Attending: Emergency Medicine | Admitting: Emergency Medicine

## 2020-10-08 ENCOUNTER — Encounter: Payer: Self-pay | Admitting: Emergency Medicine

## 2020-10-08 DIAGNOSIS — Z20822 Contact with and (suspected) exposure to covid-19: Secondary | ICD-10-CM | POA: Diagnosis not present

## 2020-10-08 DIAGNOSIS — J069 Acute upper respiratory infection, unspecified: Secondary | ICD-10-CM | POA: Diagnosis not present

## 2020-10-08 LAB — HELICOBACTER PYLORI  SPECIAL ANTIGEN
MICRO NUMBER:: 11136885
SPECIMEN QUALITY: ADEQUATE

## 2020-10-08 LAB — GASTROINTESTINAL PATHOGEN PANEL PCR

## 2020-10-08 MED ORDER — BENZONATATE 100 MG PO CAPS
100.0000 mg | ORAL_CAPSULE | Freq: Three times a day (TID) | ORAL | 0 refills | Status: DC
Start: 2020-10-08 — End: 2021-08-09

## 2020-10-08 MED ORDER — DICYCLOMINE HCL 10 MG PO CAPS: 10.0000 mg | ORAL_CAPSULE | Freq: Three times a day (TID) | ORAL | 0 refills | Status: DC

## 2020-10-08 NOTE — Discharge Instructions (Signed)
Your COVID test is pending - it is important to quarantine / isolate at home until your results are back. °If you test positive and would like further evaluation for persistent or worsening symptoms, you may schedule an E-visit or virtual (video) visit throughout the [facility] MyChart app or website. ° °PLEASE NOTE: If you develop severe chest pain or shortness of breath please go to the ER or call 9-1-1 for further evaluation --> DO NOT schedule electronic or virtual visits for this. °Please call our office for further guidance / recommendations as needed. ° °For information about the Covid vaccine, please visit [email or web address] °

## 2020-10-08 NOTE — ED Triage Notes (Signed)
Pt said as of Sunday she started having sore throat and then progressed to cough, congestion, low grader temps, headache. Pt said generalized body aches.

## 2020-10-08 NOTE — ED Provider Notes (Signed)
[name]    CSN: [id number] Arrival date & time: 10/08/20  1318      History   Chief Complaint Chief Complaint  Patient presents with  . Generalized Body Aches  . Cough    HPI Veronica Burgess is a 29 y.o. female  Veronica Burgess is a 28 y.o. female here for evaluation of a cough.  The cough is without wheezing, dyspnea or hemoptysis, productive of clear sputum and is aggravated by cold air and fumes. Onset of symptoms was 1 week ago, stable since that time.  Associated symptoms include fever, postnasal drip, sputum production and malaise and myalgias. Patient does not have a history of asthma. Patient has not had recent travel. Patient does not have a history of smoking. Patient  has not had a previous chest x-ray. Patient has not had a PPD done.  Home covid test negative.  Requesting flu testing.  Taking APAP with some relief. The following portions of the patient's history were reviewed and updated as appropriate: allergies, current medications, past family history, past medical history, past social history, past surgical history and problem list.     Past Medical History:  Diagnosis Date  . Abdominal pain   . Bipolar disorder (HCC)    Bipolar  . Migraines   . UTI (urinary tract infection)     Patient Active Problem List   Diagnosis Date Noted  . Fatigue 09/23/2020  . 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 Chronic abdominal pain 02/03/2020  . Rectal bleeding 02/03/2020  . Bipolar 2 disorder (HCC) 02/03/2020  . Migraines 02/03/2020  . Bilateral hand pain 02/03/2020  . GAD (generalized anxiety disorder) 02/13/2018    Past Surgical History:  Procedure Laterality Date  . TONSILLECTOMY  2005    OB History    Gravida  1   Para  1   Term  1   Preterm      AB      Living  1     SAB      TAB      Ectopic      Multiple      Live Births  1            Home Medications    Prior to Admission  medications   Medication Sig Start Date End Date Taking? Authorizing Provider  benzonatate (TESSALON) 100 MG capsule Take 1 capsule (100 mg total) by mouth every 8 (eight) hours. 10/08/20   Hall-Potvin, [place], PA-C  buPROPion (WELLBUTRIN XL) 300 MG 24 hr tablet Take 1 tablet (300 mg total) by mouth daily. 09/29/20 03/28/21  Pucilowski, Roosvelt Maser, MD  drospirenone-ethinyl estradiol (YASMIN 28) 3-0.03 MG tablet Take 1 tablet by mouth daily. 05/12/20   Anyanwu, Jethro Bastos, MD  LamoTRIgine 200 MG TB24 24 hour tablet Take 1 tablet (200 mg total) by mouth at bedtime. 09/29/20 03/28/21  Pucilowski, Roosvelt Maser, MD  levonorgestrel (MIRENA) 20 MCG/24HR IUD 1 each by Intrauterine route once. 02/01/17   [provider]  propranolol ER (INDERAL LA) 120 MG 24 hr capsule Take 1 capsule (120 mg total) by mouth daily. For headache prevention. 09/02/20   Doreene Nest, NP  SUMAtriptan (IMITREX) 50 MG tablet Take 1 tablet by mouth at migraine onset, may repeat in 2 hours if headache persists or recurs. 09/14/20   Doreene Nest, NP  Vitamin D, Ergocalciferol, (DRISDOL) 1.25 MG (50000 UNIT) CAPS capsule Take 1 capsule by  mouth once weekly for 12 weeks. 09/24/20   Doreene Nest, NP  FLUoxetine (PROZAC) 20 MG capsule Take 1 capsule (20 mg total) by mouth daily. 08/19/20 09/21/20  Pucilowski, Roosvelt Maser, MD    Family History Family History  Problem Relation Age of Onset  . Depression Mother   . Hyperlipidemia Mother   . Thyroid disease Mother   . Depression Father   . Hypertension Father   . ADD / ADHD Father   . Depression Brother   . ADD / ADHD Brother   . Depression Maternal Grandmother   . Diabetes Maternal Grandmother   . Hyperlipidemia Maternal Grandmother   . Hypertension Maternal Grandmother   . Thyroid disease Maternal Grandmother   . Alcohol abuse Maternal Grandfather   . COPD Maternal Grandfather   . Depression Maternal Grandfather   . Depression Paternal Grandmother   . Schizophrenia  Paternal Grandmother   . Drug abuse Paternal Grandmother   . Hypertension Paternal Grandmother     Social History Social History   Tobacco Use  . Smoking status: Never Smoker  . Smokeless tobacco: Never Used  Vaping Use  . Vaping Use: Never used  Substance Use Topics  . Alcohol use: Yes    Comment: social   . Drug use: Not Currently     Allergies   Patient has no known allergies.   Review of Systems Review of Systems  Constitutional: Positive for fatigue and fever.  HENT: Positive for congestion. Negative for ear pain, sinus pain, sore throat and voice change.   Eyes: Negative for pain, redness and visual disturbance.  Respiratory: Positive for cough. Negative for shortness of breath.   Cardiovascular: Negative for chest pain and palpitations.  Gastrointestinal: Negative for abdominal pain, diarrhea and vomiting.  Musculoskeletal: Negative for arthralgias and myalgias.  Skin: Negative for rash and wound.  Neurological: Negative for syncope and headaches.     Physical Exam Triage Vital Signs ED Triage Vitals  Enc Vitals Group     BP 10/08/20 1330 112/73     Pulse Rate 10/08/20 1330 87     Resp 10/08/20 1330 16     Temp 10/08/20 1330 99 F (37.2 C)     Temp Source 10/08/20 1330 Oral     SpO2 10/08/20 1330 97 %     Weight 10/08/20 1332 180 lb (81.6 kg)     Height 10/08/20 1332 5\' 1"  (1.549 m)     Head Circumference --      Peak Flow --      Pain Score 10/08/20 1332 3     Pain Loc --      Pain Edu? --      Excl. in GC? --    No data found.  Updated Vital Signs BP 112/73 (BP Location: Right Arm)   Pulse 87   Temp 99 F (37.2 C) (Oral)   Resp 16   Ht 5\' 1"  (1.549 m)   Wt 180 lb (81.6 kg)   SpO2 97%   BMI 34.01 kg/m   Visual Acuity Right Eye Distance:   Left Eye Distance:   Bilateral Distance:    Right Eye Near:   Left Eye Near:    Bilateral Near:     Physical Exam Constitutional:      General: She is not in acute distress.    Appearance:  She is not ill-appearing or diaphoretic.  HENT:     Head: Normocephalic and atraumatic.     Mouth/Throat:  Mouth: Mucous membranes are moist.     Pharynx: Oropharynx is clear. No oropharyngeal exudate or posterior oropharyngeal erythema.  Eyes:     General: No scleral icterus.    Conjunctiva/sclera: Conjunctivae normal.     Pupils: Pupils are equal, round, and reactive to light.  Neck:     Comments: Trachea midline, negative JVD Cardiovascular:     Rate and Rhythm: Normal rate and regular rhythm.     Heart sounds: No murmur heard.  No gallop.   Pulmonary:     Effort: Pulmonary effort is normal. No respiratory distress.     Breath sounds: No wheezing, rhonchi or rales.  Musculoskeletal:     Cervical back: Neck supple. No tenderness.  Lymphadenopathy:     Cervical: No cervical adenopathy.  Skin:    Capillary Refill: Capillary refill takes less than 2 seconds.     Coloration: Skin is not jaundiced or pale.     Findings: No rash.  Neurological:     General: No focal deficit present.     Mental Status: She is alert and oriented to person, place, and time.      UC Treatments / Results  Labs (all labs ordered are listed, but only abnormal results are displayed) Labs Reviewed  COVID-19, FLU A+B AND RSV    EKG   Radiology No results found.  Procedures Procedures (including critical care time)  Medications Ordered in UC Medications - No data to display  Initial Impression / Assessment and Plan / UC Course  I have reviewed the triage vital signs and the nursing notes.  Pertinent labs & imaging results that were available during my care of the patient were reviewed by me and considered in my medical decision making (see chart for details).     Patient afebrile, nontoxic, with SpO2 97%.  Outside tamiflu window; Flu A/B and Covid PCR pending.  Patient to quarantine until results are back.  We will treat supportively as outlined below.  Return precautions discussed,  patient verbalized understanding and is agreeable to plan. Final Clinical Impressions(s) / UC Diagnoses   Final diagnoses:  Encounter for screening laboratory testing for COVID-19 virus  Viral URI with cough     Discharge Instructions     Your COVID test is pending - it is important to quarantine / isolate at home until your results are back. If you test positive and would like further evaluation for persistent or worsening symptoms, you may schedule an E-visit or virtual (video) visit throughout the [facility] app or website.  PLEASE NOTE: If you develop severe chest pain or shortness of breath please go to the ER or call 9-1-1 for further evaluation --> DO NOT schedule electronic or virtual visits for this. Please call our office for further guidance / recommendations as needed.  For information about the Covid vaccine, please visit [email or web address]    ED Prescriptions    Medication Sig Dispense Auth. Provider   benzonatate (TESSALON) 100 MG capsule Take 1 capsule (100 mg total) by mouth every 8 (eight) hours. 21 capsule Hall-Potvin, [place], PA-C     PDMP not reviewed this encounter.   Hall-Potvin, [place], [place] 10/08/20 1345

## 2020-10-09 LAB — COVID-19, FLU A+B AND RSV
Influenza A, NAA: NOT DETECTED
Influenza B, NAA: NOT DETECTED
RSV, NAA: NOT DETECTED
SARS-CoV-2, NAA: NOT DETECTED

## 2020-11-13 ENCOUNTER — Telehealth (INDEPENDENT_AMBULATORY_CARE_PROVIDER_SITE_OTHER): Payer: 59 | Admitting: Psychiatry

## 2020-11-13 ENCOUNTER — Other Ambulatory Visit: Payer: Self-pay

## 2020-11-13 ENCOUNTER — Telehealth (HOSPITAL_COMMUNITY): Payer: Self-pay | Admitting: *Deleted

## 2020-11-13 DIAGNOSIS — F411 Generalized anxiety disorder: Secondary | ICD-10-CM | POA: Diagnosis not present

## 2020-11-13 DIAGNOSIS — F3181 Bipolar II disorder: Secondary | ICD-10-CM | POA: Diagnosis not present

## 2020-11-13 MED ORDER — LURASIDONE HCL 40 MG PO TABS: 40.0000 mg | ORAL_TABLET | Freq: Every day | ORAL | 2 refills | Status: DC

## 2020-11-13 MED ORDER — LAMOTRIGINE 200 MG PO TABS: 200.0000 mg | ORAL_TABLET | Freq: Every day | ORAL | 2 refills | Status: DC

## 2020-11-13 NOTE — Progress Notes (Signed)
BH MD/PA/NP OP Progress Note  11/13/2020 9:15 AM Veronica Burgess  MRN:  [id number] Interview was conducted by phone and I verified that I was speaking with the correct person using two identifiers. I discussed the limitations of evaluation and management by telemedicine and  the availability of in person appointments. Patient expressed understanding and agreed to proceed. Participants in the visit: patient (location - home); physician (location - home office).  Chief Complaint: Depression.  HPI: 29 yo single white female who has been followed for bipolar disorder and anxiety by Barbette Merino at [facility] since April of 2019. Patient's insurance changed and she now established regular psychiatric follow up at [facility] psychiatry. [name] reports that her mood problems started after birth of her daughter 6 years ago - she developed post-partum depression, then anxiety, then mood swings. She reports having week long (somethimes longer) periods of increased activity, energy, racing thoughts, decreased need for sleep. During such periods she is more productive, accomplishes more both at work and at home. She tends to clean excessively but denies engaging in high risk behaviors. These periods are typically followed by depressive episodes of similar length. She feels down, unmotivated, tired, focuses poorly and oversleeps. She has multiple episodes (both types) in a year. Additionally she has a tendency to ruminate, worry excessively and this is more pronounced during depressive episodes. She has tried several medications for mood/anxiety: sertraline (became "manic" on it), buspirone, aripiprazole, quetiapine - felt tired and gained weight on both. She is currently on bupropion XL 450 mg and topiramate 100 mg bid as a "mood stabilizer". Interestingly she has never been tried on mood stabilizers - lithium, divalproate, carbamazepine, lamotrigine or oxcarbazepine. She wants to avoid taking  medications that carry risk of weight gain or sedation.Wehave added Lamictal (her insurance plan covers XR but not regular form apparently) which she now is at 200 mg at HS. Mood at this time stable but anxiety is still present. She ruminates and has a tendency to engage in either skin picking (in the past) and more recently chewing her fingernails. She does not reports having panic attacks.Wellkbutrin which she has been on for several months appears to have limited benefit for her anxiety. We have added fluoxetine 20 mg but her chronic fatigue worsened and she stopped taking it. Over past several weeks she became gradually more depressed, apathetic, was taking Lamictal intermittently only. She is not suicidal but her mood is not getting any better.   Visit Diagnosis:    ICD-10-CM   1. Bipolar 2 disorder (HCC)  F31.81   2. GAD (generalized anxiety disorder)  F41.1     Past Psychiatric History: Please see intake H&P.  Past Medical History:  Past Medical History:  Diagnosis Date  . Abdominal pain   . Bipolar disorder (HCC)    Bipolar  . Migraines   . UTI (urinary tract infection)     Past Surgical History:  Procedure Laterality Date  . TONSILLECTOMY  2005    Family Psychiatric History: Reviewed.  Family History:  Family History  Problem Relation Age of Onset  . Depression Mother   . Hyperlipidemia Mother   . Thyroid disease Mother   . Depression Father   . Hypertension Father   . ADD / ADHD Father   . Depression Brother   . ADD / ADHD Brother   . Depression Maternal Grandmother   . Diabetes Maternal Grandmother   . Hyperlipidemia Maternal Grandmother   . Hypertension Maternal Grandmother   .  Thyroid disease Maternal Grandmother   . Alcohol abuse Maternal Grandfather   . COPD Maternal Grandfather   . Depression Maternal Grandfather   . Depression Paternal Grandmother   . Schizophrenia Paternal Grandmother   . Drug abuse Paternal Grandmother   . Hypertension Paternal  Grandmother     Social History:  Social History   Socioeconomic History  . Marital status: Single    Spouse name: Not on file  . Number of children: 1  . Years of education: Not on file  . Highest education level: Not on file  Occupational History  . Occupation: Environmental health practitioner  Tobacco Use  . Smoking status: Never Smoker  . Smokeless tobacco: Never Used  Vaping Use  . Vaping Use: Never used  Substance and Sexual Activity  . Alcohol use: Yes    Comment: social   . Drug use: Not Currently  . Sexual activity: Yes    Birth control/protection: I.U.D., Pill  Other Topics Concern  . 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   Lives with her 41 yo daughter, boyfriend.   Social Determinants of Health   Financial Resource Strain: Not on file  Food Insecurity: Not on file  Transportation Needs: Not on file  Physical Activity: Not on file  Stress: Not on file  Social Connections: Not on file    Allergies: No Known Allergies  Metabolic Disorder Labs: No results found for: HGBA1C, MPG No results found for: PROLACTIN No results found for: CHOL, TRIG, HDL, CHOLHDL, VLDL, LDLCALC Lab Results  Component Value Date   TSH 3.09 09/23/2020   TSH 2.273 01/28/2020    Therapeutic Level Labs: No results found for: LITHIUM No results found for: VALPROATE No components found for:  CBMZ  Current Medications: Current Outpatient Medications  Medication Sig Dispense Refill  . benzonatate (TESSALON) 100 MG capsule Take 1 capsule (100 mg total) by mouth every 8 (eight) hours. 21 capsule 0  . buPROPion (WELLBUTRIN XL) 300 MG 24 hr tablet Take 1 tablet (300 mg total) by mouth daily. 90 tablet 1  . dicyclomine (BENTYL) 10 MG capsule Take 1 capsule (10 mg total) by mouth 3 (three) times daily before meals. As needed for abdominal cramping and bloating. 30 capsule 0  .  drospirenone-ethinyl estradiol (YASMIN 28) 3-0.03 MG tablet Take 1 tablet by mouth daily. 28 tablet 11  . lamoTRIgine (LAMICTAL) 200 MG tablet Take 1 tablet (200 mg total) by mouth at bedtime. 30 tablet 2  . levonorgestrel (MIRENA) 20 MCG/24HR IUD 1 each by Intrauterine route once.    . lurasidone (LATUDA) 40 MG TABS tablet Take 1 tablet (40 mg total) by mouth daily with supper. 30 tablet 2  . propranolol ER (INDERAL LA) 120 MG 24 hr capsule Take 1 capsule (120 mg total) by mouth daily. For headache prevention. 90 capsule 1  . SUMAtriptan (IMITREX) 50 MG tablet Take 1 tablet by mouth at migraine onset, may repeat in 2 hours if headache persists or recurs. 10 tablet 0  . Vitamin D, Ergocalciferol, (DRISDOL) 1.25 MG (50000 UNIT) CAPS capsule Take 1 capsule by mouth once weekly for 12 weeks. 12 capsule 0   No current facility-administered medications for this visit.       Psychiatric Specialty Exam: Review of Systems  Psychiatric/Behavioral: Positive for decreased concentration. The patient is nervous/anxious.   All other systems reviewed and are negative.   There were  no vitals taken for this visit.There is no height or weight on file to calculate BMI.  General Appearance: NA  Eye Contact:  NA  Speech:  Clear and Coherent and Normal Rate  Volume:  Normal  Mood:  Anxious and Depressed  Affect:  NA  Thought Process:  Goal Directed  Orientation:  Full (Time, Place, and Person)  Thought Content: Rumination   Suicidal Thoughts:  No  Homicidal Thoughts:  No  Memory:  Immediate;   Fair Recent;   Fair Remote;   Good  Judgement:  Fair  Insight:  Fair  Psychomotor Activity:  NA  Concentration:  Concentration: Fair  Recall:  Fair  Fund of Knowledge: Good  Language: Good  Akathisia:  Negative  Handed:  Right  AIMS (if indicated): not done  Assets:  Communication Skills Desire for Improvement Financial Resources/Insurance Housing Social Support Talents/Skills  ADL's:  Intact   Cognition: WNL  Sleep:  Good    Assessment and Plan: 29 yo single white female who has been followed for bipolar disorder and anxiety by Barbette Merino at [facility] since April of 2019. Patient's insurance changed and she now established regular psychiatric follow up at [facility] psychiatry. Tanairy reports that her mood problems started after birth of her daughter 6 years ago - she developed post-partum depression, then anxiety, then mood swings. She reports having week long (somethimes longer) periods of increased activity, energy, racing thoughts, decreased need for sleep. During such periods she is more productive, accomplishes more both at work and at home. She tends to clean excessively but denies engaging in high risk behaviors. These periods are typically followed by depressive episodes of similar length. She feels down, unmotivated, tired, focuses poorly and oversleeps. She has multiple episodes (both types) in a year. Additionally she has a tendency to ruminate, worry excessively and this is more pronounced during depressive episodes. She has tried several medications for mood/anxiety: sertraline (became "manic" on it), buspirone, aripiprazole, quetiapine - felt tired and gained weight on both. She is currently on bupropion XL 450 mg and topiramate 100 mg bid as a "mood stabilizer". Interestingly she has never been tried on mood stabilizers - lithium, divalproate, carbamazepine, lamotrigine or oxcarbazepine. She wants to avoid taking medications that carry risk of weight gain or sedation.Wehave added Lamictal (her insurance plan covers XR but not regular form apparently) which she now is at 200 mg at HS. Mood at this time stable but anxiety is still present. She ruminates and has a tendency to engage in either skin picking (in the past) and more recently chewing her fingernails. She does not reports having panic attacks.Wellkbutrin which she has been on for several months  appears to have limited benefit for her anxiety. We have added fluoxetine 20 mg but her chronic fatigue worsened and she stopped taking it. Over past several weeks she became gradually more depressed, apathetic, was taking Lamictal intermittently only. She is not suicidal but her mood is not getting any better.  Dx: Bipolar 2 disorder rapid cycling, depressed; GAD  Plan: We willcontinueLamictal but change it to regular form (ER is covered but is more expensive than regular one) 200 mg at HS, Wellbutrin XL300 mg. We will add lurasidone 40 mg at HS with dinner.Next appointment in2 months.The plan was discussed with patient who had an opportunity to ask questions and these were all answered. I spend19minutes in phone consultation with the patient.   Magdalene Patricia, MD 11/13/2020, 9:15 AM

## 2020-11-13 NOTE — Telephone Encounter (Signed)
Writer spoke with pt to inform that she may pick up Latuda samples on Monday 11/16/20. Pt verbalizes understanding.

## 2020-11-19 ENCOUNTER — Telehealth (HOSPITAL_COMMUNITY): Payer: Self-pay | Admitting: *Deleted

## 2020-11-19 NOTE — Telephone Encounter (Signed)
PA for Veronica Burgess has been submitted, and denied twice by [facility]. Do we need to appeal? Pt has not come by office to pick up samples. Please review and advise.

## 2020-11-20 NOTE — Telephone Encounter (Signed)
She failed Seroquel and Abilify and I doubt they will approve  Vraylar as it is more expensive than Latuda. Perhaps we should appeal as the number of medications FDA approved for treatment of bipolar depression is very limited.

## 2020-12-01 ENCOUNTER — Ambulatory Visit: Payer: 59 | Admitting: Gastroenterology

## 2020-12-17 ENCOUNTER — Other Ambulatory Visit: Payer: Self-pay | Admitting: Primary Care

## 2020-12-18 NOTE — Telephone Encounter (Signed)
Do you want her to stay on script or change to otc

## 2020-12-18 NOTE — Telephone Encounter (Signed)
According to my result note in October 2021 she needs repeat vitamin D and B12 labs which are pending. Please have her come in for these labs so that we can determine if we need to continue her prescription.  Also, is she taking vitamin B12?  How much?

## 2020-12-24 ENCOUNTER — Telehealth (HOSPITAL_COMMUNITY): Payer: Self-pay

## 2020-12-24 NOTE — Telephone Encounter (Signed)
[employer] C[employer] ([facility] ON EXCHANGE) APPROVED   LATUDA 40MG  TABLET EFFECTIVE 12/18/2020 TO 12/17/2021  REFERENCE # [id number] S/W REGINA

## 2020-12-24 NOTE — Telephone Encounter (Signed)
FYI - Called [facility] on [address] to notify them that patient's PA on her Lurasidone 40mg  got approved. The pharmacist stated that patient has a $150 deductible so her plan is charging her $1,077.60 for the medication. Pharmacist stated that she's going to call the patient to discuss this with her

## 2020-12-30 ENCOUNTER — Other Ambulatory Visit: Payer: Self-pay

## 2020-12-30 ENCOUNTER — Telehealth (INDEPENDENT_AMBULATORY_CARE_PROVIDER_SITE_OTHER): Payer: 59 | Admitting: Psychiatry

## 2020-12-30 DIAGNOSIS — F411 Generalized anxiety disorder: Secondary | ICD-10-CM | POA: Diagnosis not present

## 2020-12-30 DIAGNOSIS — F3181 Bipolar II disorder: Secondary | ICD-10-CM | POA: Diagnosis not present

## 2020-12-30 MED ORDER — DESVENLAFAXINE SUCCINATE ER 50 MG PO TB24
50.0000 mg | ORAL_TABLET | Freq: Every day | ORAL | 1 refills | Status: DC
Start: 2020-12-30 — End: 2021-02-02

## 2020-12-30 NOTE — Progress Notes (Signed)
BH MD/PA/NP OP Progress Note  12/30/2020 11:15 AM Veronica Burgess  MRN:  [id number] Interview was conducted by phone and I verified that I was speaking with the correct person using two identifiers. I discussed the limitations of evaluation and management by telemedicine and  the availability of in person appointments. Patient expressed understanding and agreed to proceed. Participants in the visit: patient (location - home); physician (location - home office).  Chief Complaint: Anxiety, some depression.  HPI: 30 yo single white female who has been followed for bipolar disorder and anxiety by Barbette Merino at [facility] since April of 2019. Patient's insurance changed and shenowestablishedregular psychiatric follow up at [facility]. Veronica Burgess reports that her mood problems started after birth of her daughter 6 years ago - she developed post-partum depression, then anxiety, then mood swings. She reports having week long (somethimes longer) periods of increased activity, energy, racing thoughts, decreased need for sleep. During such periods she is more productive, accomplishes more both at work and at home. She tends to clean excessively but denies engaging in high risk behaviors. These periods are typically followed by depressive episodes of similar length. She feels down, unmotivated, tired, focuses poorly and oversleeps. She has multiple episodes (both types) in a year. Additionally she has a tendency to ruminate, worry excessively and this is more pronounced during depressive episodes. She has tried several medications for mood/anxiety: sertraline (became "manic" on it), buspirone, aripiprazole, quetiapine - felt tired and gained weight on both. She is currently on bupropion XL 450 mg and topiramate 100 mg bid as a "mood stabilizer". Interestingly she has never been tried on mood stabilizers - lithium, divalproate, carbamazepine, lamotrigine or oxcarbazepine. She wants to avoid  taking medications that carry risk of weight gain or sedation.Wehave added Lamictal (her insurance plan covers XR but not regular form apparently) which she now is at200 mg at HS. Mood at this time stable but anxiety is still present. She ruminates and has a tendency to engage in either skin picking (in the past) and more recently chewing her fingernails. She does not reports having panic attacks.Wellkbutrin which she has been on for several months appears to have limited benefit for her anxiety.We have added fluoxetine 20 mg but her chronic fatigue worsened and she stopped taking it. She has been anxious with some depression. She has been taking Lamictal at HS but only 100 mg - mood does not fluctuate. She takes melatonin for occasional insomnia. We thought about trying lurasidone but her copayment would be $150 monthly.    Visit Diagnosis:    ICD-10-CM   1. GAD (generalized anxiety disorder)  F41.1   2. Bipolar 2 disorder (HCC)  F31.81     Past Psychiatric History: Please see intake H&P.  Past Medical History:  Past Medical History:  Diagnosis Date  . Abdominal pain   . Bipolar disorder (HCC)    Bipolar  . Migraines   . UTI (urinary tract infection)     Past Surgical History:  Procedure Laterality Date  . TONSILLECTOMY  2005    Family Psychiatric History: Reviewed.  Family History:  Family History  Problem Relation Age of Onset  . Depression Mother   . Hyperlipidemia Mother   . Thyroid disease Mother   . Depression Father   . Hypertension Father   . ADD / ADHD Father   . Depression Brother   . ADD / ADHD Brother   . Depression Maternal Grandmother   . Diabetes Maternal Grandmother   .  Hyperlipidemia Maternal Grandmother   . Hypertension Maternal Grandmother   . Thyroid disease Maternal Grandmother   . Alcohol abuse Maternal Grandfather   . COPD Maternal Grandfather   . Depression Maternal Grandfather   . Depression Paternal Grandmother   . Schizophrenia  Paternal Grandmother   . Drug abuse Paternal Grandmother   . Hypertension Paternal Grandmother     Social History:  Social History   Socioeconomic History  . Marital status: Single    Spouse name: Not on file  . Number of children: 1  . Years of education: Not on file  . Highest education level: Not on file  Occupational History  . Occupation: Environmental health practitioner  Tobacco Use  . Smoking status: Never Smoker  . Smokeless tobacco: Never Used  Vaping Use  . Vaping Use: Never used  Substance and Sexual Activity  . Alcohol use: Yes    Comment: social   . Drug use: Not Currently  . Sexual activity: Yes    Birth control/protection: I.U.D., Pill  Other Topics Concern  . 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   Lives with her 32 yo daughter, boyfriend.   Social Determinants of Health   Financial Resource Strain: Not on file  Food Insecurity: Not on file  Transportation Needs: Not on file  Physical Activity: Not on file  Stress: Not on file  Social Connections: Not on file    Allergies: No Known Allergies  Metabolic Disorder Labs: No results found for: HGBA1C, MPG No results found for: PROLACTIN No results found for: CHOL, TRIG, HDL, CHOLHDL, VLDL, LDLCALC Lab Results  Component Value Date   TSH 3.09 09/23/2020   TSH 2.273 01/28/2020    Therapeutic Level Labs: No results found for: LITHIUM No results found for: VALPROATE No components found for:  CBMZ  Current Medications: Current Outpatient Medications  Medication Sig Dispense Refill  . desvenlafaxine (PRISTIQ) 50 MG 24 hr tablet Take 1 tablet (50 mg total) by mouth daily. 30 tablet 1  . benzonatate (TESSALON) 100 MG capsule Take 1 capsule (100 mg total) by mouth every 8 (eight) hours. 21 capsule 0  . buPROPion (WELLBUTRIN XL) 300 MG 24 hr tablet Take 1 tablet (300 mg total) by mouth daily.  90 tablet 1  . dicyclomine (BENTYL) 10 MG capsule Take 1 capsule (10 mg total) by mouth 3 (three) times daily before meals. As needed for abdominal cramping and bloating. 30 capsule 0  . drospirenone-ethinyl estradiol (YASMIN 28) 3-0.03 MG tablet Take 1 tablet by mouth daily. 28 tablet 11  . lamoTRIgine (LAMICTAL) 200 MG tablet Take 1 tablet (200 mg total) by mouth at bedtime. 30 tablet 2  . levonorgestrel (MIRENA) 20 MCG/24HR IUD 1 each by Intrauterine route once.    . propranolol ER (INDERAL LA) 120 MG 24 hr capsule Take 1 capsule (120 mg total) by mouth daily. For headache prevention. 90 capsule 1  . SUMAtriptan (IMITREX) 50 MG tablet Take 1 tablet by mouth at migraine onset, may repeat in 2 hours if headache persists or recurs. 10 tablet 0  . Vitamin D, Ergocalciferol, (DRISDOL) 1.25 MG (50000 UNIT) CAPS capsule Take 1 capsule by mouth once weekly for 12 weeks. 12 capsule 0   No current facility-administered medications for this visit.      Psychiatric Specialty Exam: Review of Systems  Constitutional: Positive for fatigue.  Psychiatric/Behavioral: The patient is nervous/anxious.  There were no vitals taken for this visit.There is no height or weight on file to calculate BMI.  General Appearance: NA  Eye Contact:  NA  Speech:  Clear and Coherent and Normal Rate  Volume:  Normal  Mood:  Anxious and Depressed  Affect:  NA  Thought Process:  Goal Directed and Linear  Orientation:  Full (Time, Place, and Person)  Thought Content: Logical   Suicidal Thoughts:  No  Homicidal Thoughts:  No  Memory:  Immediate;   Good Recent;   Good Remote;   Good  Judgement:  Good  Insight:  Good  Psychomotor Activity:  NA  Concentration:  Concentration: Good  Recall:  Good  Fund of Knowledge: Good  Language: Good  Akathisia:  Negative  Handed:  Right  AIMS (if indicated): not done  Assets:  Communication Skills Desire for Improvement Financial Resources/Insurance Housing Social  Support Talents/Skills  ADL's:  Intact  Cognition: WNL  Sleep:  Fair    Assessment and Plan: 30 yo single white female who has been followed for bipolar disorder and anxiety by Barbette Merino at [facility] since April of 2019. Patient's insurance changed and shenowestablishedregular psychiatric follow up at [facility] psychiatry. Veronica Burgess reports that her mood problems started after birth of her daughter 6 years ago - she developed post-partum depression, then anxiety, then mood swings. She reports having week long (somethimes longer) periods of increased activity, energy, racing thoughts, decreased need for sleep. During such periods she is more productive, accomplishes more both at work and at home. She tends to clean excessively but denies engaging in high risk behaviors. These periods are typically followed by depressive episodes of similar length. She feels down, unmotivated, tired, focuses poorly and oversleeps. She has multiple episodes (both types) in a year. Additionally she has a tendency to ruminate, worry excessively and this is more pronounced during depressive episodes. She has tried several medications for mood/anxiety: sertraline (became "manic" on it), buspirone, aripiprazole, quetiapine - felt tired and gained weight on both. She is currently on bupropion XL 450 mg and topiramate 100 mg bid as a "mood stabilizer". Interestingly she has never been tried on mood stabilizers - lithium, divalproate, carbamazepine, lamotrigine or oxcarbazepine. She wants to avoid taking medications that carry risk of weight gain or sedation.Wehave added Lamictal (her insurance plan covers XR but not regular form apparently) which she now is at200 mg at HS. Mood at this time stable but anxiety is still present. She ruminates and has a tendency to engage in either skin picking (in the past) and more recently chewing her fingernails. She does not reports having panic attacks.Wellkbutrin  which she has been on for several months appears to have limited benefit for her anxiety.We have added fluoxetine 20 mg but her chronic fatigue worsened and she stopped taking it. She has been anxious with some depression. She has been taking Lamictal at HS but only 100 mg - mood does not fluctuate. She takes melatonin for occasional insomnia. We thought about trying lurasidone but her copayment would be $150 monthly.   Dx: Bipolar 2 disorder rapid cycling, depressed mild; GAD  Plan: We willcontinueLamictalbut increase it to 200 mg at HS,Wellbutrin XL300 mg. We will add desvenlafaxine 50 mg for anxiety. Low risk of weight gain and fatigue. Next appointment in5 weeks.The plan was discussed with patient who had an opportunity to ask questions and these were all answered. I spend62minutes in phone consultation with the patient.     Kirrah Mustin  A Saavi Mceachron, MD 12/30/2020, 11:15 AM

## 2021-01-06 ENCOUNTER — Other Ambulatory Visit (HOSPITAL_COMMUNITY): Payer: Self-pay | Admitting: Psychiatry

## 2021-01-06 ENCOUNTER — Other Ambulatory Visit: Payer: Self-pay | Admitting: Primary Care

## 2021-01-06 DIAGNOSIS — E538 Deficiency of other specified B group vitamins: Secondary | ICD-10-CM

## 2021-01-06 DIAGNOSIS — E559 Vitamin D deficiency, unspecified: Secondary | ICD-10-CM

## 2021-01-06 NOTE — Telephone Encounter (Signed)
Patient scheduled labs for 01/15/21 at 7:55 am. She has not taken her B12 in awhile. States she kept forgetting to take it.

## 2021-01-13 ENCOUNTER — Other Ambulatory Visit (HOSPITAL_COMMUNITY): Payer: Self-pay | Admitting: Psychiatry

## 2021-01-15 ENCOUNTER — Other Ambulatory Visit: Payer: 59

## 2021-02-02 ENCOUNTER — Other Ambulatory Visit: Payer: Self-pay

## 2021-02-02 ENCOUNTER — Telehealth (INDEPENDENT_AMBULATORY_CARE_PROVIDER_SITE_OTHER): Payer: 59 | Admitting: Psychiatry

## 2021-02-02 DIAGNOSIS — F3181 Bipolar II disorder: Secondary | ICD-10-CM

## 2021-02-02 DIAGNOSIS — F411 Generalized anxiety disorder: Secondary | ICD-10-CM

## 2021-02-02 MED ORDER — DESVENLAFAXINE SUCCINATE ER 100 MG PO TB24
100.0000 mg | ORAL_TABLET | Freq: Every day | ORAL | 1 refills | Status: DC
Start: 2021-02-02 — End: 2021-03-11

## 2021-02-02 MED ORDER — BUPROPION HCL ER (XL) 300 MG PO TB24: 300.0000 mg | ORAL_TABLET | Freq: Every day | ORAL | 1 refills | Status: DC

## 2021-02-02 NOTE — Progress Notes (Signed)
BH MD/PA/NP OP Progress Note  02/02/2021 11:40 AM Veronica Burgess  MRN:  [id number] Interview was conducted by phone and I verified that I was speaking with the correct person using two identifiers. I discussed the limitations of evaluation and management by telemedicine and  the availability of in person appointments. Patient expressed understanding and agreed to proceed. Participants in the visit: patient (location - home); physician (location - home office).  Chief Complaint: "Still some anxiety".  HPI: 30 yo single white female who has been followed for bipolar disorder and anxiety by Barbette Merino at [facility] since April of 2019. Patient's insurance changed and shenowestablishedregular psychiatric follow up at [facility] psychiatry. Mandi reports that her mood problems started after birth of her daughter 6 years ago - she developed post-partum depression, then anxiety, then mood swings. She reports having week long (somethimes longer) periods of increased activity, energy, racing thoughts, decreased need for sleep. During such periods she is more productive, accomplishes more both at work and at home. She tends to clean excessively but denies engaging in high risk behaviors. These periods are typically followed by depressive episodes of similar length. She feels down, unmotivated, tired, focuses poorly and oversleeps. She has multiple episodes (both types) in a year. Additionally she has a tendency to ruminate, worry excessively and this is more pronounced during depressive episodes. She has tried several medications for mood/anxiety: sertraline (became "manic" on it), buspirone, aripiprazole, quetiapine - felt tired and gained weight on both. She is currently on bupropion XL 450 mg and topiramate 100 mg bid as a "mood stabilizer". Interestingly she has never been tried on mood stabilizers - lithium, divalproate, carbamazepine, lamotrigine or oxcarbazepine. She wants to avoid  taking medications that carry risk of weight gain or sedation.Wehave added Lamictal (her insurance plan covers XR but not regular formapparently) which she now is at200 mg at HS. Mood at this time stable but anxiety is still present. She ruminates and has a tendency to engage in either skin picking (in the past) and more recently chewing her fingernails. She does not reports having panic attacks.Wellkbutrin which she has been on for several months appears to have limited benefit for her anxiety.We have added fluoxetine 20 mg but her chronic fatigue worsened and she stopped taking it.She has been anxious with some depression. She has been taking Lamictal at HS  - mood does not fluctuate. She takes melatonin for occasional insomnia. We thought about trying lurasidone but her copayment would be $150 monthly. We have added desvenlafaxine 50 mg a month ago - some improvement in anxiety but she would like to try a higher dose (tolerates it well).     Visit Diagnosis:    ICD-10-CM   1. GAD (generalized anxiety disorder)  F41.1   2. Bipolar 2 disorder (HCC)  F31.81     Past Psychiatric History: Please see intake H&P.  Past Medical History:  Past Medical History:  Diagnosis Date  . Abdominal pain   . Bipolar disorder (HCC)    Bipolar  . Migraines   . UTI (urinary tract infection)     Past Surgical History:  Procedure Laterality Date  . TONSILLECTOMY  2005    Family Psychiatric History: Reviewed.  Family History:  Family History  Problem Relation Age of Onset  . Depression Mother   . Hyperlipidemia Mother   . Thyroid disease Mother   . Depression Father   . Hypertension Father   . ADD / ADHD Father   .  Depression Brother   . ADD / ADHD Brother   . Depression Maternal Grandmother   . Diabetes Maternal Grandmother   . Hyperlipidemia Maternal Grandmother   . Hypertension Maternal Grandmother   . Thyroid disease Maternal Grandmother   . Alcohol abuse Maternal Grandfather   .  COPD Maternal Grandfather   . Depression Maternal Grandfather   . Depression Paternal Grandmother   . Schizophrenia Paternal Grandmother   . Drug abuse Paternal Grandmother   . Hypertension Paternal Grandmother     Social History:  Social History   Socioeconomic History  . Marital status: Single    Spouse name: Not on file  . Number of children: 1  . Years of education: Not on file  . Highest education level: Not on file  Occupational History  . Occupation: Environmental health practitioneradministrative assistant  Tobacco Use  . Smoking status: Never Smoker  . Smokeless tobacco: Never Used  Vaping Use  . Vaping Use: Never used  Substance and Sexual Activity  . Alcohol use: Yes    Comment: social   . Drug use: Not Currently  . Sexual activity: Yes    Birth control/protection: I.U.D., Pill  Other Topics Concern  . 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   Lives with her 56 yo daughter, boyfriend.   Social Determinants of Health   Financial Resource Strain: Not on file  Food Insecurity: Not on file  Transportation Needs: Not on file  Physical Activity: Not on file  Stress: Not on file  Social Connections: Not on file    Allergies: No Known Allergies  Metabolic Disorder Labs: No results found for: HGBA1C, MPG No results found for: PROLACTIN No results found for: CHOL, TRIG, HDL, CHOLHDL, VLDL, LDLCALC Lab Results  Component Value Date   TSH 3.09 09/23/2020   TSH 2.273 01/28/2020    Therapeutic Level Labs: No results found for: LITHIUM No results found for: VALPROATE No components found for:  CBMZ  Current Medications: Current Outpatient Medications  Medication Sig Dispense Refill  . benzonatate (TESSALON) 100 MG capsule Take 1 capsule (100 mg total) by mouth every 8 (eight) hours. 21 capsule 0  . buPROPion (WELLBUTRIN XL) 300 MG 24 hr tablet Take 1 tablet (300 mg total) by  mouth daily. 90 tablet 1  . desvenlafaxine (PRISTIQ) 100 MG 24 hr tablet Take 1 tablet (100 mg total) by mouth daily. 30 tablet 1  . dicyclomine (BENTYL) 10 MG capsule Take 1 capsule (10 mg total) by mouth 3 (three) times daily before meals. As needed for abdominal cramping and bloating. 30 capsule 0  . drospirenone-ethinyl estradiol (YASMIN 28) 3-0.03 MG tablet Take 1 tablet by mouth daily. 28 tablet 11  . lamoTRIgine (LAMICTAL) 200 MG tablet TAKE 1 TABLET(200 MG) BY MOUTH AT BEDTIME 30 tablet 2  . levonorgestrel (MIRENA) 20 MCG/24HR IUD 1 each by Intrauterine route once.    . propranolol ER (INDERAL LA) 120 MG 24 hr capsule Take 1 capsule (120 mg total) by mouth daily. For headache prevention. 90 capsule 1  . SUMAtriptan (IMITREX) 50 MG tablet Take 1 tablet by mouth at migraine onset, may repeat in 2 hours if headache persists or recurs. 10 tablet 0  . Vitamin D, Ergocalciferol, (DRISDOL) 1.25 MG (50000 UNIT) CAPS capsule Take 1 capsule by mouth once weekly for 12 weeks. 12 capsule 0   No current facility-administered medications for this visit.  Psychiatric Specialty Exam: Review of Systems  Psychiatric/Behavioral: The patient is nervous/anxious.   All other systems reviewed and are negative.   There were no vitals taken for this visit.There is no height or weight on file to calculate BMI.  General Appearance: NA  Eye Contact:  NA  Speech:  Clear and Coherent and Normal Rate  Volume:  Normal  Mood:  Anxious  Affect:  NA  Thought Process:  Goal Directed  Orientation:  Full (Time, Place, and Person)  Thought Content: Logical   Suicidal Thoughts:  No  Homicidal Thoughts:  No  Memory:  Immediate;   Good Recent;   Good Remote;   Good  Judgement:  Good  Insight:  Good  Psychomotor Activity:  NA  Concentration:  Concentration: Good  Recall:  Good  Fund of Knowledge: Good  Language: Good  Akathisia:  Negative  Handed:  Right  AIMS (if indicated): not done  Assets:   Communication Skills Desire for Improvement Financial Resources/Insurance Housing Talents/Skills  ADL's:  Intact  Cognition: WNL  Sleep:  Fair    Assessment and Plan: 30 yo single white female who has been followed for bipolar disorder and anxiety by Barbette Merino at [facility] since April of 2019. Patient's insurance changed and shenowestablishedregular psychiatric follow up at [facility]. Laurren reports that her mood problems started after birth of her daughter 6 years ago - she developed post-partum depression, then anxiety, then mood swings. She reports having week long (somethimes longer) periods of increased activity, energy, racing thoughts, decreased need for sleep. During such periods she is more productive, accomplishes more both at work and at home. She tends to clean excessively but denies engaging in high risk behaviors. These periods are typically followed by depressive episodes of similar length. She feels down, unmotivated, tired, focuses poorly and oversleeps. She has multiple episodes (both types) in a year. Additionally she has a tendency to ruminate, worry excessively and this is more pronounced during depressive episodes. She has tried several medications for mood/anxiety: sertraline (became "manic" on it), buspirone, aripiprazole, quetiapine - felt tired and gained weight on both. She is currently on bupropion XL 450 mg and topiramate 100 mg bid as a "mood stabilizer". Interestingly she has never been tried on mood stabilizers - lithium, divalproate, carbamazepine, lamotrigine or oxcarbazepine. She wants to avoid taking medications that carry risk of weight gain or sedation.Wehave added Lamictal (her insurance plan covers XR but not regular formapparently) which she now is at200 mg at HS. Mood at this time stable but anxiety is still present. She ruminates and has a tendency to engage in either skin picking (in the past) and more recently chewing  her fingernails. She does not reports having panic attacks.Wellkbutrin which she has been on for several months appears to have limited benefit for her anxiety.We have added fluoxetine 20 mg but her chronic fatigue worsened and she stopped taking it.She has been anxious with some depression. She has been taking Lamictal at HS  - mood does not fluctuate. She takes melatonin for occasional insomnia. We thought about trying lurasidone but her copayment would be $150 monthly. We have added desvenlafaxine 50 mg a month ago - some improvement in anxiety but she would like to try a higher dose (tolerates it well).  Dx: Bipolar 2 disorder rapid cycling, depressed mild; GAD  Plan: We willcontinueLamictal200 mg at HS,Wellbutrin XL300 mg and increase desvenlafaxine to 100 mg for anxiety. Low risk of weight gain and fatigue. Next  appointment in4-5 weeks with a new provider.The plan was discussed with patient who had an opportunity to ask questions and these were all answered. I spend69minutes inphone consultationwith the patient.    Magdalene Patricia, MD 02/02/2021, 11:40 AM

## 2021-02-23 ENCOUNTER — Other Ambulatory Visit (HOSPITAL_COMMUNITY): Payer: Self-pay | Admitting: Psychiatry

## 2021-03-03 NOTE — Telephone Encounter (Signed)
Called patient and rescheduled sooner for 4/8

## 2021-03-04 ENCOUNTER — Encounter (HOSPITAL_COMMUNITY): Payer: Self-pay

## 2021-03-05 ENCOUNTER — Ambulatory Visit: Payer: 59 | Admitting: Primary Care

## 2021-03-11 ENCOUNTER — Encounter (HOSPITAL_COMMUNITY): Payer: Self-pay | Admitting: Psychiatry

## 2021-03-11 ENCOUNTER — Telehealth (INDEPENDENT_AMBULATORY_CARE_PROVIDER_SITE_OTHER): Payer: 59 | Admitting: Psychiatry

## 2021-03-11 ENCOUNTER — Ambulatory Visit: Payer: 59 | Admitting: Primary Care

## 2021-03-11 ENCOUNTER — Other Ambulatory Visit: Payer: Self-pay

## 2021-03-11 DIAGNOSIS — F3181 Bipolar II disorder: Secondary | ICD-10-CM

## 2021-03-11 DIAGNOSIS — F411 Generalized anxiety disorder: Secondary | ICD-10-CM | POA: Diagnosis not present

## 2021-03-11 MED ORDER — LAMOTRIGINE 200 MG PO TABS: ORAL_TABLET | ORAL | 2 refills | Status: DC

## 2021-03-11 MED ORDER — DESVENLAFAXINE SUCCINATE ER 100 MG PO TB24
100.0000 mg | ORAL_TABLET | Freq: Every day | ORAL | 1 refills | Status: DC
Start: 2021-03-11 — End: 2021-08-09

## 2021-03-11 NOTE — Progress Notes (Signed)
BH MD/PA/NP OP Progress Note  03/11/2021 9:24 AM Veronica Burgess  MRN:  [id number] Interview was conducted by phone and I verified that I was speaking with the correct person using two identifiers. I discussed the limitations of evaluation and management by telemedicine and  the availability of in person appointments. Patient expressed understanding and agreed to proceed. Participants in the visit: patient (location - home); physician (location - home office).  Chief Complaint: " having some anxiety".  HPI: 30 yo single white female who has been followed for bipolar disorder and anxiety by Dr.P who is not with [facility] and patient is transitioning to this provider. Veronica Burgess reports that her mood problems started after birth of her daughter 6 years ago - she developed post-partum depression, then anxiety, then mood swings.  Today she reports doing okay mood wise since starting pristiq, she is at 100mg  now. She reports she is somewhat anxious.Last 2 weeks she has not been feeling motivated and not cleaning her home. She reports having week long (somethimes longer) periods of increased activity, energy, racing thoughts, decreased need for sleep. During such periods she is more productive, accomplishes more both at work and at home. She tends to clean excessively but denies engaging in high risk behaviors. These periods are typically followed by depressive episodes of similar length. She feels down, unmotivated, tired, focuses poorly and oversleeps. She has multiple episodes (both types) in a year. Additionally she has a tendency to ruminate, worry excessively and this is more pronounced during depressive episodes. She has tried several medications for mood/anxiety: sertraline (became "manic" on it), buspirone, aripiprazole, quetiapine - felt tired and gained weight on both.  She is currently on bupropion XL 300 mg and topiramate 100 mg bid as a "mood stabilizer".       Visit Diagnosis:  No diagnosis  found.  Past Psychiatric History: Please see intake H&P.  Past Medical History:  Past Medical History:  Diagnosis Date  . Abdominal pain   . Bipolar disorder (HCC)    Bipolar  . Migraines   . UTI (urinary tract infection)     Past Surgical History:  Procedure Laterality Date  . TONSILLECTOMY  2005    Family Psychiatric History: Reviewed.  Family History:  Family History  Problem Relation Age of Onset  . Depression Mother   . Hyperlipidemia Mother   . Thyroid disease Mother   . Depression Father   . Hypertension Father   . ADD / ADHD Father   . Depression Brother   . ADD / ADHD Brother   . Depression Maternal Grandmother   . Diabetes Maternal Grandmother   . Hyperlipidemia Maternal Grandmother   . Hypertension Maternal Grandmother   . Thyroid disease Maternal Grandmother   . Alcohol abuse Maternal Grandfather   . COPD Maternal Grandfather   . Depression Maternal Grandfather   . Depression Paternal Grandmother   . Schizophrenia Paternal Grandmother   . Drug abuse Paternal Grandmother   . Hypertension Paternal Grandmother     Social History:  Social History   Socioeconomic History  . Marital status: Single    Spouse name: Not on file  . Number of children: 1  . Years of education: Not on file  . Highest education level: Not on file  Occupational History  . Occupation: 2006  Tobacco Use  . Smoking status: Never Smoker  . Smokeless tobacco: Never Used  Vaping Use  . Vaping Use: Never used  Substance and Sexual Activity  . Alcohol  use: Yes    Comment: social   . Drug use: Not Currently  . Sexual activity: Yes    Birth control/protection: I.U.D., Pill  Other Topics Concern  . 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   Lives with her 37 yo daughter, boyfriend.   Social Determinants of Health   Financial Resource  Strain: Not on file  Food Insecurity: Not on file  Transportation Needs: Not on file  Physical Activity: Not on file  Stress: Not on file  Social Connections: Not on file    Allergies: No Known Allergies  Metabolic Disorder Labs: No results found for: HGBA1C, MPG No results found for: PROLACTIN No results found for: CHOL, TRIG, HDL, CHOLHDL, VLDL, LDLCALC Lab Results  Component Value Date   TSH 3.09 09/23/2020   TSH 2.273 01/28/2020    Therapeutic Level Labs: No results found for: LITHIUM No results found for: VALPROATE No components found for:  CBMZ  Current Medications: Current Outpatient Medications  Medication Sig Dispense Refill  . benzonatate (TESSALON) 100 MG capsule Take 1 capsule (100 mg total) by mouth every 8 (eight) hours. 21 capsule 0  . buPROPion (WELLBUTRIN XL) 300 MG 24 hr tablet Take 1 tablet (300 mg total) by mouth daily. 90 tablet 1  . desvenlafaxine (PRISTIQ) 100 MG 24 hr tablet Take 1 tablet (100 mg total) by mouth daily. 30 tablet 1  . dicyclomine (BENTYL) 10 MG capsule Take 1 capsule (10 mg total) by mouth 3 (three) times daily before meals. As needed for abdominal cramping and bloating. 30 capsule 0  . drospirenone-ethinyl estradiol (YASMIN 28) 3-0.03 MG tablet Take 1 tablet by mouth daily. 28 tablet 11  . lamoTRIgine (LAMICTAL) 200 MG tablet TAKE 1 TABLET(200 MG) BY MOUTH AT BEDTIME 30 tablet 2  . levonorgestrel (MIRENA) 20 MCG/24HR IUD 1 each by Intrauterine route once.    . propranolol ER (INDERAL LA) 120 MG 24 hr capsule Take 1 capsule (120 mg total) by mouth daily. For headache prevention. 90 capsule 1  . SUMAtriptan (IMITREX) 50 MG tablet Take 1 tablet by mouth at migraine onset, may repeat in 2 hours if headache persists or recurs. 10 tablet 0  . Vitamin D, Ergocalciferol, (DRISDOL) 1.25 MG (50000 UNIT) CAPS capsule Take 1 capsule by mouth once weekly for 12 weeks. 12 capsule 0   No current facility-administered medications for this visit.      Psychiatric Specialty Exam: Review of Systems  Psychiatric/Behavioral: The patient is nervous/anxious.   All other systems reviewed and are negative.   There were no vitals taken for this visit.There is no height or weight on file to calculate BMI.  General Appearance: NA  Eye Contact:  NA  Speech:  Clear and Coherent and Normal Rate  Volume:  Normal  Mood:  Anxious  Affect:  NA  Thought Process:  Goal Directed  Orientation:  Full (Time, Place, and Person)  Thought Content: Logical   Suicidal Thoughts:  No  Homicidal Thoughts:  No  Memory:  Immediate;   Good Recent;   Good Remote;   Good  Judgement:  Good  Insight:  Good  Psychomotor Activity:  NA  Concentration:  Concentration: Good  Recall:  Good  Fund of Knowledge: Good  Language: Good  Akathisia:  Negative  Handed:  Right  AIMS (if indicated): not done  Assets:  Communication Skills Desire for Improvement Financial Resources/Insurance Housing  Talents/Skills  ADL's:  Intact  Cognition: WNL  Sleep:  Fair    Assessment and Plan: 30 yo single white female who has been followed for bipolar disorder and anxiety by Dr.P who is no longer with [facility]. Reports feeling better mood wise but more anxious. Tolerating medications well, denies any side effects.  Plan: We will continue Lamictal 200 mg at HS, Wellbutrin XL 300 mg and continue desvenlafaxine at 100 mg for anxiety. Low risk of weight gain and fatigue. Next appointment in 4 weeks. The plan was discussed with patient who had an opportunity to ask questions and these were all answered. I spend 15 minutes in phone consultation with the patient (unable to connect via video). I spent 20 minutes on chart review.      Patrick North, MD 03/11/2021, 9:24 AM

## 2021-03-12 ENCOUNTER — Encounter (HOSPITAL_COMMUNITY): Payer: Self-pay | Admitting: Psychiatry

## 2021-03-22 ENCOUNTER — Encounter: Payer: Self-pay | Admitting: Radiology

## 2021-03-24 ENCOUNTER — Encounter: Payer: Self-pay | Admitting: Radiology

## 2021-06-14 ENCOUNTER — Telehealth: Payer: Self-pay | Admitting: Primary Care

## 2021-06-14 NOTE — Telephone Encounter (Signed)
Patient would like to transfer care to [facility]. Please advise

## 2021-06-15 NOTE — Telephone Encounter (Signed)
Perfectly fine with me, thanks!

## 2021-06-23 NOTE — Telephone Encounter (Signed)
Appt schedule with pt 

## 2021-07-02 ENCOUNTER — Telehealth: Payer: Self-pay

## 2021-07-02 NOTE — Telephone Encounter (Signed)
Vernona Rieger had agreed to a TOC from Vernona Rieger, NP, and pt was scheduled for a Thursday in October.  Pt called today stating she has a new job and Mondays are her days off and would prefer a Monday appt so that she does not have to ask for a day off.  Would it be permissible for pt to see Dr. Carmelia Roller instead since he has availability on Mondays?  Please advise.  I just wanted both providers to be aware of the situation.

## 2021-07-02 NOTE — Telephone Encounter (Signed)
OK w me.  

## 2021-07-06 NOTE — Telephone Encounter (Signed)
Pt called back and a toc appointment was scheduled with Dr. Carmelia Roller.

## 2021-07-06 NOTE — Telephone Encounter (Signed)
LMOVM for pt to call and set up TOC appt with Dr. Carmelia Roller.

## 2021-08-09 ENCOUNTER — Other Ambulatory Visit: Payer: Self-pay | Admitting: Family Medicine

## 2021-08-09 ENCOUNTER — Ambulatory Visit (INDEPENDENT_AMBULATORY_CARE_PROVIDER_SITE_OTHER): Payer: 59 | Admitting: Family Medicine

## 2021-08-09 ENCOUNTER — Encounter: Payer: Self-pay | Admitting: Family Medicine

## 2021-08-09 ENCOUNTER — Other Ambulatory Visit: Payer: Self-pay

## 2021-08-09 VITALS — BP 108/64 | HR 102 | Temp 98.0°F | Ht 61.0 in | Wt 194.5 lb

## 2021-08-09 DIAGNOSIS — Z0000 Encounter for general adult medical examination without abnormal findings: Secondary | ICD-10-CM

## 2021-08-09 DIAGNOSIS — E669 Obesity, unspecified: Secondary | ICD-10-CM | POA: Insufficient documentation

## 2021-08-09 NOTE — Progress Notes (Signed)
Chief Complaint  Patient presents with   New Patient (Initial Visit)    Well Female Veronica Burgess is here for a complete physical.   Her last physical was >1 year ago.  Current diet: in general, a "healthy" diet.   Current exercise: walking dogs Weight trend: starting to lose wt again.  Fatigue out of ordinary? No. Seat belt? Yes.    Health maintenance Tetanus- Yes HIV- Yes Hep C- Yes  Past Medical History:  Diagnosis Date   Abdominal pain    Bipolar disorder (HCC)    Bipolar   Migraines    UTI (urinary tract infection)      Past Surgical History:  Procedure Laterality Date   TONSILLECTOMY  2005    Medications  Current Outpatient Medications on File Prior to Visit  Medication Sig Dispense Refill   levonorgestrel (MIRENA) 20 MCG/24HR IUD 1 each by Intrauterine route once.     Allergies No Known Allergies  Family History Family History  Problem Relation Age of Onset   Depression Mother    Hyperlipidemia Mother    Thyroid disease Mother    Depression Father    Hypertension Father    ADD / ADHD Father    Depression Brother    ADD / ADHD Brother    Depression Maternal Grandmother    Diabetes Maternal Grandmother    Hyperlipidemia Maternal Grandmother    Hypertension Maternal Grandmother    Thyroid disease Maternal Grandmother    Alcohol abuse Maternal Grandfather    COPD Maternal Grandfather    Depression Maternal Grandfather    Depression Paternal Grandmother    Schizophrenia Paternal Grandmother    Drug abuse Paternal Grandmother    Hypertension Paternal Grandmother     Review of Systems: Constitutional: no fevers or chills Eye:  no recent significant change in vision Ear/Nose/Mouth/Throat:  Ears:  no hearing loss Nose/Mouth/Throat:  no complaints of nasal congestion, no sore throat Cardiovascular:  no chest pain Respiratory:  no shortness of breath Gastrointestinal:  no abdominal pain, no change in bowel habits GU:  Female: negative for  dysuria Musculoskeletal/Extremities:  no pain of the joints Integumentary (Skin/Breast):  no abnormal skin lesions reported Neurologic:  no headaches Endocrine: No unexpected weight changes Hematologic/Lymphatic:  no night sweats  Exam BP 108/64   Pulse (!) 102   Temp 98 F (36.7 C) (Oral)   Ht 5\' 1"  (1.549 m)   Wt 194 lb 8 oz (88.2 kg)   SpO2 98%   BMI 36.75 kg/m  General:  well developed, well nourished, in no apparent distress Skin:  no significant moles, warts, or growths Head:  no masses, lesions, or tenderness Eyes:  pupils equal and round, sclera anicteric without injection Ears:  canals without lesions, TMs shiny without retraction, no obvious effusion, no erythema Nose:  nares patent, septum midline, mucosa normal Throat/Pharynx:  lips and gingiva without lesion; tongue and uvula midline; non-inflamed pharynx; no exudates or postnasal drainage Neck: neck supple without adenopathy, thyromegaly, or masses Lungs:  clear to auscultation, breath sounds equal bilaterally, no respiratory distress Cardio:  regular rate and rhythm, no bruits, no LE edema Abdomen:  abdomen soft, nontender; bowel sounds normal; no masses or organomegaly Rectal: Deferred Musculoskeletal:  symmetrical muscle groups noted without atrophy or deformity Extremities:  no clubbing, cyanosis, or edema, no deformities, no skin discoloration Neuro:  gait normal; deep tendon reflexes normal and symmetric Psych: well oriented with normal range of affect and appropriate judgment/insight  Assessment and Plan  Well adult  exam - Plan: CBC, Comprehensive metabolic panel, Lipid panel  Obesity (BMI 30-39.9) - Plan: Amb Ref to Medical Weight Management   Well 30 y.o. female. Counseled on diet and exercise. Other orders as above. Will come back for labs.  Flu shot rec'd for mid Oct.  Refer [facility] for weight loss aid.  Follow up in 1 year pending the above workup. The patient voiced understanding and agreement to  the plan.  Jilda Roche [place], DO 08/09/21 1:22 PM

## 2021-08-09 NOTE — Patient Instructions (Addendum)
Give us 2-3 business days to get the results of your labs back.   Keep the diet clean and stay active.  I recommend getting the flu shot in mid October. This suggestion would change if the CDC comes out with a different recommendation.   If you do not hear anything about your referral in the next 1-2 weeks, call our office and ask for an update.  Let us know if you need anything. 

## 2021-08-13 ENCOUNTER — Ambulatory Visit
Admission: EM | Admit: 2021-08-13 | Discharge: 2021-08-13 | Disposition: A | Discharge status: Home / Self Care | Payer: 59 | Attending: Urgent Care | Admitting: Urgent Care

## 2021-08-13 ENCOUNTER — Other Ambulatory Visit: Payer: Self-pay

## 2021-08-13 DIAGNOSIS — N3000 Acute cystitis without hematuria: Secondary | ICD-10-CM

## 2021-08-13 DIAGNOSIS — R300 Dysuria: Secondary | ICD-10-CM | POA: Diagnosis present

## 2021-08-13 DIAGNOSIS — N898 Other specified noninflammatory disorders of vagina: Secondary | ICD-10-CM

## 2021-08-13 LAB — POCT URINALYSIS DIP (MANUAL ENTRY)
Bilirubin, UA: NEGATIVE
Glucose, UA: NEGATIVE mg/dL
Ketones, POC UA: NEGATIVE mg/dL
Nitrite, UA: NEGATIVE
Protein Ur, POC: NEGATIVE mg/dL
Spec Grav, UA: 1.01 (ref 1.010–1.025)
Urobilinogen, UA: 0.2 E.U./dL
pH, UA: 7 (ref 5.0–8.0)

## 2021-08-13 MED ORDER — NITROFURANTOIN MONOHYD MACRO 100 MG PO CAPS: 100.0000 mg | ORAL_CAPSULE | Freq: Two times a day (BID) | ORAL | 0 refills | Status: DC

## 2021-08-13 NOTE — ED Triage Notes (Signed)
Pt c/o lower back pain, pelvic pain, feels bloated, polyuria, dysuria, "something is squeezing my kidneys" states she gets frequent UTIs but concerned for kidney infection. Denies hematuria, hx of kidney inf. States tried azo at home without relief.   Also c/o discharge (white, thick)  Onset last night.

## 2021-08-13 NOTE — ED Provider Notes (Signed)
[facility]   MRN: [id number] DOB: 08/03/1991  Subjective:   Veronica Burgess is a 30 y.o. female presenting for several day history of acute onset dysuria, urinary frequency, low back pain, pelvic pain, vaginal discharge.  Has a history of UTIs, has had a kidney infection in the past as well.  Has been using Azo without any relief.  Patient is sexually active, has 1 female partner, monogamous relationship. No concern for oral, anal infections.  Would like to make sure she gets checked through the cervical swab test.  She does have an IUD, no pregnancy test needed.  She does admit that she does not drink water very consistently, holds her urine as well.  She drinks about a pot of coffee per day, drinks a lot of pop as well.  No current facility-administered medications for this encounter.  Current Outpatient Medications:    levonorgestrel (MIRENA) 20 MCG/24HR IUD, 1 each by Intrauterine route once., Disp: , Rfl:    No Known Allergies  Past Medical History:  Diagnosis Date   Abdominal pain    Bipolar disorder (HCC)    Bipolar   Migraines    UTI (urinary tract infection)      Past Surgical History:  Procedure Laterality Date   TONSILLECTOMY  2005    Family History  Problem Relation Age of Onset   Depression Mother    Hyperlipidemia Mother    Thyroid disease Mother    Depression Father    Hypertension Father    ADD / ADHD Father    Depression Brother    ADD / ADHD Brother    Depression Maternal Grandmother    Diabetes Maternal Grandmother    Hyperlipidemia Maternal Grandmother    Hypertension Maternal Grandmother    Thyroid disease Maternal Grandmother    Alcohol abuse Maternal Grandfather    COPD Maternal Grandfather    Depression Maternal Grandfather    Depression Paternal Grandmother    Schizophrenia Paternal Grandmother    Drug abuse Paternal Grandmother    Hypertension Paternal Grandmother     Social History   Tobacco Use   Smoking status: Never    Smokeless tobacco: Never  Vaping Use   Vaping Use: Never used  Substance Use Topics   Alcohol use: Yes    Comment: social    Drug use: Not Currently    ROS   Objective:   Vitals: BP 139/78 (BP Location: Left Arm)   Pulse 92   Temp 98.2 F (36.8 C) (Oral)   Resp 18   SpO2 97%   Physical Exam Constitutional:      General: She is not in acute distress.    Appearance: Normal appearance. She is well-developed. She is not ill-appearing, toxic-appearing or diaphoretic.  HENT:     Head: Normocephalic and atraumatic.     Nose: Nose normal.     Mouth/Throat:     Mouth: Mucous membranes are moist.     Pharynx: Oropharynx is clear.  Eyes:     General: No scleral icterus.       Right eye: No discharge.        Left eye: No discharge.     Extraocular Movements: Extraocular movements intact.     Conjunctiva/sclera: Conjunctivae normal.     Pupils: Pupils are equal, round, and reactive to light.  Cardiovascular:     Rate and Rhythm: Normal rate.  Pulmonary:     Effort: Pulmonary effort is normal.  Abdominal:     General: Bowel sounds  are normal. There is no distension.     Palpations: Abdomen is soft. There is no mass.     Tenderness: There is abdominal tenderness (generalized throughout). There is no right CVA tenderness, left CVA tenderness, guarding or rebound.  Skin:    General: Skin is warm and dry.  Neurological:     General: No focal deficit present.     Mental Status: She is alert and oriented to person, place, and time.  Psychiatric:        Mood and Affect: Mood normal.        Behavior: Behavior normal.        Thought Content: Thought content normal.        Judgment: Judgment normal.   Results for orders placed or performed during the hospital encounter of 08/13/21 (from the past 24 hour(s))  POCT urinalysis dipstick     Status: Abnormal   Collection Time: 08/13/21  4:24 PM  Result Value Ref Range   Color, UA yellow yellow   Clarity, UA clear clear   Glucose,  UA negative negative mg/dL   Bilirubin, UA negative negative   Ketones, POC UA negative negative mg/dL   Spec Grav, UA 3.295 1.884 - 1.025   Blood, UA trace-lysed (A) negative   pH, UA 7.0 5.0 - 8.0   Protein Ur, POC negative negative mg/dL   Urobilinogen, UA 0.2 0.2 or 1.0 E.U./dL   Nitrite, UA Negative Negative   Leukocytes, UA Small (1+) (A) Negative    Assessment and Plan :   PDMP not reviewed this encounter.  1. Acute cystitis without hematuria   2. Dysuria   3. Vaginal discharge     Start Macrobid to cover for acute cystitis, urine culture pending.  Recommended aggressive hydration, limiting urinary irritants. No signs of an acute abdomen.  Cervical swab test results pending as well.  Counseled patient on potential for adverse effects with medications prescribed/recommended today, ER and return-to-clinic precautions discussed, patient verbalized understanding.    Wallis Bamberg, [place] 08/13/21 [phone]

## 2021-08-13 NOTE — Discharge Instructions (Signed)

## 2021-08-15 LAB — URINE CULTURE: Culture: 60000 — AB

## 2021-08-16 ENCOUNTER — Other Ambulatory Visit: Payer: Self-pay

## 2021-08-16 ENCOUNTER — Other Ambulatory Visit (INDEPENDENT_AMBULATORY_CARE_PROVIDER_SITE_OTHER): Payer: 59

## 2021-08-16 DIAGNOSIS — Z0000 Encounter for general adult medical examination without abnormal findings: Secondary | ICD-10-CM

## 2021-08-16 LAB — COMPREHENSIVE METABOLIC PANEL
ALT: 12 U/L (ref 0–35)
AST: 12 U/L (ref 0–37)
Albumin: 4.3 g/dL (ref 3.5–5.2)
Alkaline Phosphatase: 62 U/L (ref 39–117)
BUN: 7 mg/dL (ref 6–23)
CO2: 28 mEq/L (ref 19–32)
Calcium: 9.2 mg/dL (ref 8.4–10.5)
Chloride: 103 mEq/L (ref 96–112)
Creatinine, Ser: 0.72 mg/dL (ref 0.40–1.20)
GFR: 111.38 mL/min (ref >60.00)
Glucose, Bld: 90 mg/dL (ref 70–99)
Potassium: 4.1 mEq/L (ref 3.5–5.1)
Sodium: 138 mEq/L (ref 135–145)
Total Bilirubin: 0.6 mg/dL (ref 0.2–1.2)
Total Protein: 6.9 g/dL (ref 6.0–8.3)

## 2021-08-16 LAB — LIPID PANEL
Cholesterol: 149 mg/dL (ref 0–200)
HDL: 42.3 mg/dL (ref >39.00)
LDL Cholesterol: 85 mg/dL (ref 0–99)
NonHDL: 107.18
Total CHOL/HDL Ratio: 4
Triglycerides: 113 mg/dL (ref 0.0–149.0)
VLDL: 22.6 mg/dL (ref 0.0–40.0)

## 2021-08-16 LAB — CBC
HCT: 41.2 % (ref 36.0–46.0)
Hemoglobin: 13.6 g/dL (ref 12.0–15.0)
MCHC: 32.9 g/dL (ref 30.0–36.0)
MCV: 88.1 fl (ref 78.0–100.0)
Platelets: 220 10*3/uL (ref 150.0–400.0)
RBC: 4.68 Mil/uL (ref 3.87–5.11)
RDW: 13.5 % (ref 11.5–15.5)
WBC: 5.1 10*3/uL (ref 4.0–10.5)

## 2021-08-16 LAB — CERVICOVAGINAL ANCILLARY ONLY
Bacterial Vaginitis (gardnerella): NEGATIVE
Candida Glabrata: NEGATIVE
Candida Vaginitis: NEGATIVE
Chlamydia: NEGATIVE
Comment: NEGATIVE
Comment: NEGATIVE
Comment: NEGATIVE
Comment: NEGATIVE
Comment: NEGATIVE
Comment: NORMAL
Neisseria Gonorrhea: NEGATIVE
Trichomonas: NEGATIVE

## 2021-08-16 LAB — HEMOGLOBIN A1C: Hgb A1c MFr Bld: 5.4 % (ref 4.6–6.5)

## 2021-08-17 ENCOUNTER — Telehealth (HOSPITAL_COMMUNITY): Payer: Self-pay | Admitting: Emergency Medicine

## 2021-08-17 MED ORDER — CEPHALEXIN 500 MG PO CAPS: 500.0000 mg | ORAL_CAPSULE | Freq: Two times a day (BID) | ORAL | 0 refills | Status: AC

## 2021-09-09 ENCOUNTER — Encounter: Payer: 59 | Admitting: Family

## 2021-11-03 ENCOUNTER — Ambulatory Visit: Payer: 59 | Admitting: Obstetrics & Gynecology

## 2021-12-27 DIAGNOSIS — R41840 Attention and concentration deficit: Secondary | ICD-10-CM | POA: Diagnosis not present

## 2021-12-27 DIAGNOSIS — R69 Illness, unspecified: Secondary | ICD-10-CM | POA: Diagnosis not present

## 2021-12-27 DIAGNOSIS — F419 Anxiety disorder, unspecified: Secondary | ICD-10-CM | POA: Diagnosis not present

## 2021-12-27 DIAGNOSIS — F39 Unspecified mood [affective] disorder: Secondary | ICD-10-CM | POA: Diagnosis not present

## 2022-01-11 DIAGNOSIS — F419 Anxiety disorder, unspecified: Secondary | ICD-10-CM | POA: Diagnosis not present

## 2022-01-11 DIAGNOSIS — F39 Unspecified mood [affective] disorder: Secondary | ICD-10-CM | POA: Diagnosis not present

## 2022-01-11 DIAGNOSIS — R41840 Attention and concentration deficit: Secondary | ICD-10-CM | POA: Diagnosis not present

## 2022-01-11 DIAGNOSIS — R69 Illness, unspecified: Secondary | ICD-10-CM | POA: Diagnosis not present

## 2022-01-24 DIAGNOSIS — R41840 Attention and concentration deficit: Secondary | ICD-10-CM | POA: Diagnosis not present

## 2022-01-24 DIAGNOSIS — R69 Illness, unspecified: Secondary | ICD-10-CM | POA: Diagnosis not present

## 2022-01-24 DIAGNOSIS — F39 Unspecified mood [affective] disorder: Secondary | ICD-10-CM | POA: Diagnosis not present

## 2022-01-24 DIAGNOSIS — F419 Anxiety disorder, unspecified: Secondary | ICD-10-CM | POA: Diagnosis not present

## 2022-02-07 DIAGNOSIS — R41840 Attention and concentration deficit: Secondary | ICD-10-CM | POA: Diagnosis not present

## 2022-02-07 DIAGNOSIS — F419 Anxiety disorder, unspecified: Secondary | ICD-10-CM | POA: Diagnosis not present

## 2022-02-07 DIAGNOSIS — F39 Unspecified mood [affective] disorder: Secondary | ICD-10-CM | POA: Diagnosis not present

## 2022-02-07 DIAGNOSIS — R69 Illness, unspecified: Secondary | ICD-10-CM | POA: Diagnosis not present

## 2022-02-08 DIAGNOSIS — R69 Illness, unspecified: Secondary | ICD-10-CM | POA: Diagnosis not present

## 2022-03-15 DIAGNOSIS — F419 Anxiety disorder, unspecified: Secondary | ICD-10-CM | POA: Diagnosis not present

## 2022-03-15 DIAGNOSIS — R41840 Attention and concentration deficit: Secondary | ICD-10-CM | POA: Diagnosis not present

## 2022-03-15 DIAGNOSIS — F39 Unspecified mood [affective] disorder: Secondary | ICD-10-CM | POA: Diagnosis not present

## 2022-03-15 DIAGNOSIS — R69 Illness, unspecified: Secondary | ICD-10-CM | POA: Diagnosis not present

## 2022-05-16 DIAGNOSIS — R69 Illness, unspecified: Secondary | ICD-10-CM | POA: Diagnosis not present

## 2022-05-16 DIAGNOSIS — F419 Anxiety disorder, unspecified: Secondary | ICD-10-CM | POA: Diagnosis not present

## 2022-05-16 DIAGNOSIS — R41840 Attention and concentration deficit: Secondary | ICD-10-CM | POA: Diagnosis not present

## 2022-05-16 DIAGNOSIS — F39 Unspecified mood [affective] disorder: Secondary | ICD-10-CM | POA: Diagnosis not present

## 2022-06-14 DIAGNOSIS — R41840 Attention and concentration deficit: Secondary | ICD-10-CM | POA: Diagnosis not present

## 2022-06-14 DIAGNOSIS — R69 Illness, unspecified: Secondary | ICD-10-CM | POA: Diagnosis not present

## 2022-07-18 DIAGNOSIS — F39 Unspecified mood [affective] disorder: Secondary | ICD-10-CM | POA: Diagnosis not present

## 2022-07-18 DIAGNOSIS — R69 Illness, unspecified: Secondary | ICD-10-CM | POA: Diagnosis not present

## 2022-07-18 DIAGNOSIS — R41840 Attention and concentration deficit: Secondary | ICD-10-CM | POA: Diagnosis not present

## 2022-07-18 DIAGNOSIS — F419 Anxiety disorder, unspecified: Secondary | ICD-10-CM | POA: Diagnosis not present

## 2022-08-08 DIAGNOSIS — L700 Acne vulgaris: Secondary | ICD-10-CM | POA: Diagnosis not present

## 2022-08-15 ENCOUNTER — Encounter: Payer: 59 | Admitting: Family Medicine

## 2022-09-23 DIAGNOSIS — F419 Anxiety disorder, unspecified: Secondary | ICD-10-CM | POA: Diagnosis not present

## 2022-09-23 DIAGNOSIS — R41840 Attention and concentration deficit: Secondary | ICD-10-CM | POA: Diagnosis not present

## 2022-09-23 DIAGNOSIS — F39 Unspecified mood [affective] disorder: Secondary | ICD-10-CM | POA: Diagnosis not present

## 2022-09-23 DIAGNOSIS — R69 Illness, unspecified: Secondary | ICD-10-CM | POA: Diagnosis not present

## 2022-10-25 DIAGNOSIS — R69 Illness, unspecified: Secondary | ICD-10-CM | POA: Diagnosis not present

## 2022-10-25 DIAGNOSIS — F419 Anxiety disorder, unspecified: Secondary | ICD-10-CM | POA: Diagnosis not present

## 2022-10-25 DIAGNOSIS — R41840 Attention and concentration deficit: Secondary | ICD-10-CM | POA: Diagnosis not present

## 2022-10-25 DIAGNOSIS — F39 Unspecified mood [affective] disorder: Secondary | ICD-10-CM | POA: Diagnosis not present

## 2022-11-08 ENCOUNTER — Ambulatory Visit (INDEPENDENT_AMBULATORY_CARE_PROVIDER_SITE_OTHER): Payer: 59 | Admitting: Obstetrics & Gynecology

## 2022-11-08 ENCOUNTER — Encounter: Payer: Self-pay | Admitting: Obstetrics & Gynecology

## 2022-11-08 ENCOUNTER — Other Ambulatory Visit (HOSPITAL_COMMUNITY)
Admission: RE | Admit: 2022-11-08 | Discharge: 2022-11-08 | Disposition: A | Discharge status: Home / Self Care | Payer: 59 | Source: Ambulatory Visit | Attending: Obstetrics & Gynecology | Admitting: Obstetrics & Gynecology

## 2022-11-08 VITALS — BP 111/73 | HR 84 | Ht 61.0 in | Wt 144.0 lb

## 2022-11-08 DIAGNOSIS — R102 Pelvic and perineal pain: Secondary | ICD-10-CM | POA: Diagnosis not present

## 2022-11-08 DIAGNOSIS — Z30431 Encounter for routine checking of intrauterine contraceptive device: Secondary | ICD-10-CM

## 2022-11-08 DIAGNOSIS — Z01419 Encounter for gynecological examination (general) (routine) without abnormal findings: Secondary | ICD-10-CM | POA: Diagnosis not present

## 2022-11-08 DIAGNOSIS — R69 Illness, unspecified: Secondary | ICD-10-CM | POA: Diagnosis not present

## 2022-11-08 DIAGNOSIS — Z87410 Personal history of cervical dysplasia: Secondary | ICD-10-CM | POA: Insufficient documentation

## 2022-11-08 DIAGNOSIS — Z113 Encounter for screening for infections with a predominantly sexual mode of transmission: Secondary | ICD-10-CM | POA: Insufficient documentation

## 2022-11-08 DIAGNOSIS — G8929 Other chronic pain: Secondary | ICD-10-CM | POA: Diagnosis not present

## 2022-11-08 DIAGNOSIS — F909 Attention-deficit hyperactivity disorder, unspecified type: Secondary | ICD-10-CM | POA: Insufficient documentation

## 2022-11-08 DIAGNOSIS — Z975 Presence of (intrauterine) contraceptive device: Secondary | ICD-10-CM

## 2022-11-08 MED ORDER — DROSPIRENONE-ETHINYL ESTRADIOL 3-0.03 MG PO TABS: 1.0000 | ORAL_TABLET | Freq: Every day | ORAL | 4 refills | Status: DC

## 2022-11-08 NOTE — Progress Notes (Signed)
GYNECOLOGY ANNUAL PREVENTATIVE CARE ENCOUNTER NOTE  History:     Veronica Burgess is a 31 y.o. G27P1001 female here for a routine annual gynecologic exam.  Current complaints: desires OCPs to help with her acne and chronic pelvic pain/dyspaurenia, has been on this in the past.   Denies abnormal vaginal bleeding, discharge, problems with intercourse or other gynecologic concerns.    Gynecologic History No LMP recorded. (Menstrual status: IUD). Contraception: Mirena IUD, placed in 2017 Last Pap: 05/12/2020. 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 Colposcopy pathology showed CIN 1.   Obstetric History OB History  Gravida Para Term Preterm AB Living  1 1 1     1   SAB IAB Ectopic Multiple Live Births          1    # Outcome Date GA Lbr Len/2nd Weight Sex Delivery Anes PTL Lv  1 Term 10/28/13     Vag-Spont EPI  LIV    Past Medical History:  Diagnosis Date   Abdominal pain    ADHD (attention deficit hyperactivity disorder)    Combined   Migraines    UTI (urinary tract infection)     Past Surgical History:  Procedure Laterality Date   TONSILLECTOMY  2005    Current Outpatient Medications on File Prior to Visit  Medication Sig Dispense Refill   Adapalene-Benzoyl Peroxide 0.3-2.5 % GEL Apply topically.     Amphetamine ER (ADZENYS XR-ODT) 12.5 MG TBED Take by mouth.     levonorgestrel (MIRENA) 20 MCG/24HR IUD 1 each by Intrauterine route once.     Multiple Vitamin (MULTIVITAMIN ADULT PO) Take by mouth.     nitrofurantoin, macrocrystal-monohydrate, (MACROBID) 100 MG capsule Take 1 capsule (100 mg total) by mouth 2 (two) times daily. (Patient not taking: Reported on 11/08/2022) 10 capsule 0   [DISCONTINUED] FLUoxetine (PROZAC) 20 MG capsule Take 1 capsule (20 mg total) by mouth daily. 30 capsule 1   No current facility-administered medications on file prior to visit.    No Known Allergies  Social History:  reports that she has never smoked. She has never used smokeless tobacco.  She reports current alcohol use. She reports that she does not currently use drugs.  Family History  Problem Relation Age of Onset   Depression Mother    Hyperlipidemia Mother    Thyroid disease Mother    Depression Father    Hypertension Father    ADD / ADHD Father    Depression Brother    ADD / ADHD Brother    Depression Maternal Grandmother    Diabetes Maternal Grandmother    Hyperlipidemia Maternal Grandmother    Hypertension Maternal Grandmother    Thyroid disease Maternal Grandmother    Alcohol abuse Maternal Grandfather    COPD Maternal Grandfather    Depression Maternal Grandfather    Depression Paternal Grandmother    Schizophrenia Paternal Grandmother    Drug abuse Paternal Grandmother    Hypertension Paternal Grandmother     The following portions of the patient's history were reviewed and updated as appropriate: allergies, current medications, past family history, past medical history, past social history, past surgical history and problem list.  Review of Systems Pertinent items noted in HPI and remainder of comprehensive ROS otherwise negative.  Physical Exam:  BP 111/73   Pulse 84   Ht 5\' 1"  (1.549 m)   Wt 144 lb (65.3 kg)   BMI 27.21 kg/m  CONSTITUTIONAL: Well-developed, well-nourished female in no acute distress.  HENT:  Normocephalic, atraumatic,  External right and left ear normal.  EYES: Conjunctivae and EOM are normal. Pupils are equal, round, and reactive to light. No scleral icterus.  NECK: Normal range of motion, supple, no masses.  Normal thyroid.  SKIN: Skin is warm and dry. No rash noted. Not diaphoretic. No erythema. No pallor. MUSCULOSKELETAL: Normal range of motion. No tenderness.  No cyanosis, clubbing, or edema. NEUROLOGIC: Alert and oriented to person, place, and time. Normal reflexes, muscle tone coordination.  PSYCHIATRIC: Normal mood and affect. Normal behavior. Normal judgment and thought content. CARDIOVASCULAR: Normal heart rate  noted, regular rhythm RESPIRATORY: Clear to auscultation bilaterally. Effort and breath sounds normal, no problems with respiration noted. BREASTS: Symmetric in size. No masses, tenderness, skin changes, nipple drainage, or lymphadenopathy bilaterally. Performed in the presence of a chaperone. ABDOMEN: Soft, no distention noted.  No tenderness, rebound or guarding.  PELVIC: Normal appearing external genitalia and urethral meatus; normal appearing vaginal mucosa and cervix.  No abnormal vaginal discharge noted.  IUD strings about 1 cm in length outside cervix. Pap smear obtained.  Normal uterine size, no other palpable masses, no uterine or adnexal tenderness.  Performed in the presence of a chaperone.   Assessment and Plan:    1. Chronic female pelvic pain As per our 05/12/2020 visit, there is concern about possible endometriosis. Talked about laparoscopy for diagnosis, but will defer for now and presumptively treat. May not be getting adequate hormonal therapy from IUD, OCPs prescribed. Will evaluate response. If pain still present, consider laparoscopy, alternative treatments Dewayne Hatch, neuropathic pelvic pain medications etc).  Of note, she had a normal ultrasound in 05/19/2020. - drospirenone-ethinyl estradiol (YASMIN 28) 3-0.03 MG tablet; Take 1 tablet by mouth daily.  Dispense: 84 tablet; Refill: 4  2. Mirena IUD (intrauterine device) in place since 2017 No issues with this, she wants to continue with this. Can be in place until 2025.  3. Routine screening for STI (sexually transmitted infection) STI screen desired by patient, this was done. Will follow up results and manage accordingly. - Cytology - PAP - RPR+HBsAg+HCVAb+HIV  4. History of cervical dysplasia 5. Well woman exam with routine gynecological exam Patient was lost to follow up after her colposcopy in 2021. Emphasized need for close surveillance.  Will follow up results of pap smear and manage accordingly. - Cytology -  PAP  Routine preventative health maintenance measures emphasized. Please refer to After Visit Summary for other counseling recommendations.      Jaynie Collins, MD, FACOG Obstetrician & Gynecologist, [facility] [employer], [facility]

## 2022-11-09 LAB — RPR+HBSAG+HCVAB+...
HIV Screen 4th Generation wRfx: NONREACTIVE
Hep C Virus Ab: NONREACTIVE
Hepatitis B Surface Ag: NEGATIVE
RPR Ser Ql: NONREACTIVE

## 2022-11-14 LAB — CYTOLOGY - PAP
Chlamydia: NEGATIVE
Comment: NEGATIVE
Comment: NEGATIVE
Comment: NEGATIVE
Comment: NEGATIVE
Comment: NEGATIVE
Comment: NORMAL
Diagnosis: UNDETERMINED — AB
HPV 16: NEGATIVE
HPV 18 / 45: NEGATIVE
High risk HPV: POSITIVE — AB
Neisseria Gonorrhea: NEGATIVE
Trichomonas: NEGATIVE

## 2022-11-15 ENCOUNTER — Encounter: Payer: Self-pay | Admitting: Obstetrics & Gynecology

## 2022-11-15 ENCOUNTER — Telehealth: Payer: Self-pay | Admitting: *Deleted

## 2022-11-15 DIAGNOSIS — R87610 Atypical squamous cells of undetermined significance on cytologic smear of cervix (ASC-US): Secondary | ICD-10-CM | POA: Insufficient documentation

## 2022-11-15 NOTE — Telephone Encounter (Signed)
Called pt to go over pap results and scheduled colpo for 01/17/23 with Dr A.

## 2022-11-24 DIAGNOSIS — F419 Anxiety disorder, unspecified: Secondary | ICD-10-CM | POA: Diagnosis not present

## 2022-11-24 DIAGNOSIS — R41840 Attention and concentration deficit: Secondary | ICD-10-CM | POA: Diagnosis not present

## 2022-11-24 DIAGNOSIS — F39 Unspecified mood [affective] disorder: Secondary | ICD-10-CM | POA: Diagnosis not present

## 2022-11-24 DIAGNOSIS — R69 Illness, unspecified: Secondary | ICD-10-CM | POA: Diagnosis not present

## 2023-01-16 ENCOUNTER — Telehealth: Payer: Self-pay

## 2023-01-16 NOTE — Telephone Encounter (Signed)
Attempted to reach patient regarding message left with answering service Left voicemail for patient to call office or send mychart message with details regarding appointment needs.

## 2023-01-17 ENCOUNTER — Encounter: Payer: 59 | Admitting: Obstetrics & Gynecology

## 2023-02-05 ENCOUNTER — Encounter: Payer: Self-pay | Admitting: Obstetrics & Gynecology

## 2023-02-20 DIAGNOSIS — R41840 Attention and concentration deficit: Secondary | ICD-10-CM | POA: Diagnosis not present

## 2023-02-20 DIAGNOSIS — F3162 Bipolar disorder, current episode mixed, moderate: Secondary | ICD-10-CM | POA: Diagnosis not present

## 2023-02-20 DIAGNOSIS — F419 Anxiety disorder, unspecified: Secondary | ICD-10-CM | POA: Diagnosis not present

## 2023-02-20 DIAGNOSIS — F39 Unspecified mood [affective] disorder: Secondary | ICD-10-CM | POA: Diagnosis not present

## 2023-05-16 DIAGNOSIS — F39 Unspecified mood [affective] disorder: Secondary | ICD-10-CM | POA: Diagnosis not present

## 2023-05-16 DIAGNOSIS — F419 Anxiety disorder, unspecified: Secondary | ICD-10-CM | POA: Diagnosis not present

## 2023-05-16 DIAGNOSIS — R41840 Attention and concentration deficit: Secondary | ICD-10-CM | POA: Diagnosis not present

## 2023-05-16 DIAGNOSIS — F3162 Bipolar disorder, current episode mixed, moderate: Secondary | ICD-10-CM | POA: Diagnosis not present

## 2023-06-14 ENCOUNTER — Ambulatory Visit: Payer: 59 | Admitting: Family

## 2023-06-26 ENCOUNTER — Ambulatory Visit (INDEPENDENT_AMBULATORY_CARE_PROVIDER_SITE_OTHER): Payer: 59 | Admitting: Family

## 2023-06-26 ENCOUNTER — Encounter: Payer: Self-pay | Admitting: Family

## 2023-06-26 VITALS — BP 115/72 | HR 63 | Temp 98.6°F | Ht 62.25 in | Wt 128.6 lb

## 2023-06-26 DIAGNOSIS — R002 Palpitations: Secondary | ICD-10-CM | POA: Diagnosis not present

## 2023-06-26 DIAGNOSIS — Z7689 Persons encountering health services in other specified circumstances: Secondary | ICD-10-CM | POA: Diagnosis not present

## 2023-06-26 NOTE — Progress Notes (Signed)
Subjective:    Veronica Burgess - 32 y.o. female MRN [id number]  Date of birth: September 19, 1991  HPI  Veronica Burgess is to establish care.   Current issues and/or concerns: Reports intermittent heart palpitations for "a while" with and without activity. She denies associated red flag symptoms. Reports she began running in January 2024 and while conversing with another runner noticed that her heart rate was higher than his. Reports while running her heart rate has been from 80's to 170's. Her resting heart rate is in the 50's - 60's. Reports she does drink quite a lot of coffee and energy drinks. Reports she was over 200 pounds and currently 128 pounds. Reports she was able to lose the weight due to monitoring food intake and exercise/running. No further issues/concerns for discussion today.   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 She has never used smokeless tobacco. She reports current alcohol use. She reports that she does not currently use drugs.   Family History  family history includes ADD / ADHD in her brother and father; Alcohol abuse in her maternal grandfather; COPD in her maternal grandfather; Depression in her brother, father, maternal grandfather, maternal grandmother, mother, and paternal grandmother; Diabetes in her maternal  grandmother; Drug abuse in her paternal grandmother; Hyperlipidemia in her maternal grandmother and mother; Hypertension in her father, maternal grandmother, and paternal grandmother; Schizophrenia in her paternal grandmother; Thyroid disease in her maternal grandmother and mother.   Medications: reviewed and updated   Objective:   Physical Exam BP 115/72   Pulse 63   Temp 98.6 F (37 C) (Oral)   Ht 5' 2.25" (1.581 m)   Wt 128 lb 9.6 oz (58.3 kg)   SpO2 98%   BMI 23.33 kg/m   Physical Exam HENT:     Head: Normocephalic and atraumatic.     Nose: Nose normal.     Mouth/Throat:     Mouth: Mucous membranes are moist.     Pharynx: Oropharynx is clear.  Eyes:     Extraocular Movements: Extraocular movements intact.     Conjunctiva/sclera: Conjunctivae normal.     Pupils: Pupils are equal, round, and reactive to light.  Cardiovascular:     Rate and Rhythm: Normal rate and regular rhythm.     Pulses: Normal pulses.     Heart sounds: Normal heart sounds.  Pulmonary:     Effort: Pulmonary effort is normal.     Breath sounds: Normal breath sounds.  Musculoskeletal:        General: Normal range of motion.     Cervical back: Normal range of motion and neck supple.  Neurological:     General: No focal deficit present.     Mental Status: She is alert and oriented to person, place, and time.  Psychiatric:  Mood and Affect: Mood normal.        Behavior: Behavior normal.       Assessment & Plan:  1. Encounter to establish care - Patient presents today to establish care. During the interim follow-up with primary provider as scheduled.  - Return for annual physical examination, labs, and health maintenance. Arrive fasting meaning having no food for at least 8 hours prior to appointment. You may have only water or black coffee. Please take scheduled medications as normal.  2. Heart palpitations - Patient today in office with no cardiopulmonary/acute distress. - Counseled to  limit caffeine intake.  - Referral to Cardiology for further evaluation/management. Counseled to not exercise/run until evaluated from Cardiology and medically cleared. Patient verbalized understanding. - Ambulatory referral to Cardiology    Patient was given clear instructions to go to Emergency Department or return to medical center if symptoms don't improve, worsen, or new problems develop.The patient verbalized understanding.  I discussed the assessment and treatment plan with the patient. The patient was provided an opportunity to ask questions and all were answered. The patient agreed with the plan and demonstrated an understanding of the instructions.   The patient was advised to call back or seek an in-person evaluation if the symptoms worsen or if the condition fails to improve as anticipated.    Ricky Stabs, NP 06/26/2023, 8:59 AM Primary Care at [facility]

## 2023-06-26 NOTE — Patient Instructions (Signed)
Thank you for choosing Primary Care at [facility] for your medical home!    Veronica Burgess was seen by Veronica Fendt, NP today.   Veronica Burgess primary care provider is Veronica Fendt, NP.   For the best care possible,  you should try to see Veronica Stabs, NP whenever you come to office.   We look forward to seeing you again soon!  If you have any questions about your visit today,  please call us at [phone]  Or feel free to reach your provider via MyChart.   Keeping you healthy   Get these tests Blood pressure- Have your blood pressure checked once a year by your healthcare provider.  Normal blood pressure is 120/80. Weight- Have your body mass index (BMI) calculated to screen for obesity.  BMI is a measure of body fat based on height and weight. You can also calculate your own BMI at https://www.west-esparza.com/. Cholesterol- Have your cholesterol checked regularly starting at age 74, sooner may be necessary if you have diabetes, high blood pressure, if a family member developed heart diseases at an early age or if you smoke.  Chlamydia, HIV, and other sexual transmitted disease- Get screened each year until the age of 71 then within three months of each new sexual partner. Diabetes- Have your blood sugar checked regularly if you have high blood pressure, high cholesterol, a family history of diabetes or if you are overweight.   Get these vaccines Flu shot- Every fall. Tetanus shot- Every 10 years. Menactra- Single dose; prevents meningitis.   Take these steps Don't smoke- If you do smoke, ask your healthcare provider about quitting. For tips on how to quit, go to www.smokefree.gov or call 1-800-QUIT-NOW. Be physically active- Exercise 5 days a week for at least 30 minutes.  If you are not already physically active start slow and gradually work up to 30 minutes of moderate physical activity.  Examples of moderate activity include walking briskly, mowing the yard, dancing,  swimming bicycling, etc. Eat a healthy diet- Eat a variety of healthy foods such as fruits, vegetables, low fat milk, low fat cheese, yogurt, lean meats, poultry, fish, beans, tofu, etc.  For more information on healthy eating, go to www.thenutritionsource.org Drink alcohol in moderation- Limit alcohol intake two drinks or less a day.  Never drink and drive. Dentist- Brush and floss teeth twice daily; visit your dentis twice a year. Depression-Your emotional health is as important as your physical health.  If you're feeling down, losing interest in things you normally enjoy please talk with your healthcare provider. Gun Safety- If you keep a gun in your home, keep it unloaded and with the safety lock on.  Bullets should be stored separately. Helmet use- Always wear a helmet when riding a motorcycle, bicycle, rollerblading or skateboarding. Safe sex- If you may be exposed to a sexually transmitted infection, use a condom Seat belts- Seat bels can save your life; always wear one. Smoke/Carbon Monoxide detectors- These detectors need to be installed on the appropriate level of your home.  Replace batteries at least once a year. Skin Cancer- When out in the sun, cover up and use sunscreen SPF 15 or higher. Violence- If anyone is threatening or hurting you, please tell your healthcare provider.

## 2023-06-26 NOTE — Progress Notes (Signed)
Pt wants to speak about her heart rate, feels like resting heart rate is too high.

## 2023-07-11 ENCOUNTER — Ambulatory Visit
Admission: EM | Admit: 2023-07-11 | Discharge: 2023-07-11 | Disposition: A | Discharge status: Home / Self Care | Payer: 59 | Attending: Physician Assistant | Admitting: Physician Assistant

## 2023-07-11 DIAGNOSIS — Z1152 Encounter for screening for COVID-19: Secondary | ICD-10-CM | POA: Insufficient documentation

## 2023-07-11 DIAGNOSIS — J069 Acute upper respiratory infection, unspecified: Secondary | ICD-10-CM | POA: Insufficient documentation

## 2023-07-11 LAB — POCT RAPID STREP A (OFFICE): Rapid Strep A Screen: NEGATIVE

## 2023-07-11 NOTE — ED Provider Notes (Signed)
[name]    CSN: [id number] Arrival date & time: 07/11/23  0855      History   Chief Complaint Chief Complaint  Patient presents with   Sore Throat   Generalized Body Aches    HPI Veronica Burgess is a 32 y.o. female.   Patient here today for evaluation of sore throat, body aches, low-grade fever, chills and headache that started this morning.  She states that she "feels like garbage".  She has not had any runny nose.  She has not any nausea, vomiting or diarrhea.  She states she is very tired.  She has not had any exposure to COVID or strep that she is aware of.  She has taken Tylenol and ibuprofen with mild relief.  The history is provided by the patient.  Sore Throat Pertinent negatives include no abdominal pain and no shortness of breath.    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 Authorizing Provider  acetaminophen (TYLENOL) 325 MG tablet Take  650 mg by mouth every 6 (six) hours as needed.   Yes [provider]  Amphetamine ER (ADZENYS XR-ODT) 12.5 MG TBED Take by mouth.   Yes [provider]  drospirenone-ethinyl estradiol (YASMIN 28) 3-0.03 MG tablet Take 1 tablet by mouth daily. 11/08/22  Yes Anyanwu, Jethro Bastos, MD  ibuprofen (ADVIL) 200 MG tablet Take 600 mg by mouth every 6 (six) hours as needed.   Yes [provider]  Adapalene-Benzoyl Peroxide 0.3-2.5 % GEL Apply topically. Patient not taking: Reported on 06/26/2023 08/10/22   [provider]  levonorgestrel (MIRENA) 20 MCG/24HR IUD 1 each by Intrauterine route once. 02/01/17   [provider]  Multiple Vitamin (MULTIVITAMIN ADULT PO) Take by mouth.    [provider]  FLUoxetine (PROZAC) 20 MG capsule Take 1 capsule (20 mg total) by mouth daily. 08/19/20 09/21/20  Pucilowski, Roosvelt Maser, MD    Family History Family History  Problem Relation Age of Onset   Depression Mother    Hyperlipidemia Mother    Thyroid disease Mother    Depression Father  Hypertension Father    ADD / ADHD Father    Depression Brother    ADD / ADHD Brother    Depression Maternal Grandmother    Diabetes Maternal Grandmother    Hyperlipidemia Maternal Grandmother    Hypertension Maternal Grandmother    Thyroid disease Maternal Grandmother    Alcohol abuse Maternal Grandfather    COPD Maternal Grandfather    Depression Maternal Grandfather    Depression Paternal Grandmother    Schizophrenia Paternal Grandmother    Drug abuse Paternal Grandmother    Hypertension Paternal Grandmother     Social History Social History   Tobacco Use   Smoking status: Never   Smokeless tobacco: Never  Vaping Use   Vaping status: Never Used  Substance Use Topics   Alcohol use: Yes    Comment: social    Drug use: Not Currently     Allergies   Patient has no known allergies.   Review of Systems Review of Systems  Constitutional:  Positive for chills  and fever.  HENT:  Positive for sore throat. Negative for congestion and ear pain.   Eyes:  Negative for discharge and redness.  Respiratory:  Positive for cough. Negative for shortness of breath and wheezing.   Gastrointestinal:  Negative for abdominal pain, diarrhea, nausea and vomiting.     Physical Exam Triage Vital Signs ED Triage Vitals  Encounter Vitals Group     BP 07/11/23 0902 112/73     Systolic BP Percentile --      Diastolic BP Percentile --      Pulse Rate 07/11/23 0902 68     Resp 07/11/23 0902 18     Temp 07/11/23 0902 98.8 F (37.1 C)     Temp Source 07/11/23 0902 Oral     SpO2 07/11/23 0902 98 %     Weight 07/11/23 0900 130 lb (59 kg)     Height 07/11/23 0900 5' 1.25" (1.556 m)     Head Circumference --      Peak Flow --      Pain Score 07/11/23 0900 5     Pain Loc --      Pain Education --      Exclude from Growth Chart --    No data found.  Updated Vital Signs BP 112/73 (BP Location: Left Arm)   Pulse 68   Temp 98.8 F (37.1 C) (Oral)   Resp 18   Ht 5' 1.25" (1.556 m)   Wt 130 lb (59 kg)   LMP  (LMP Unknown)   SpO2 98%   BMI 24.36 kg/m      Physical Exam Vitals and nursing note reviewed.  Constitutional:      General: She is not in acute distress.    Appearance: Normal appearance. She is not ill-appearing.  HENT:     Head: Normocephalic and atraumatic.     Right Ear: Tympanic membrane normal.     Left Ear: Tympanic membrane normal.     Nose: Congestion present.     Mouth/Throat:     Mouth: Mucous membranes are moist.     Pharynx: No oropharyngeal exudate or posterior oropharyngeal erythema.  Eyes:     Conjunctiva/sclera: Conjunctivae normal.  Cardiovascular:     Rate and Rhythm: Normal rate and regular rhythm.     Heart sounds: Normal heart sounds. No murmur heard. Pulmonary:     Effort: Pulmonary effort is normal. No respiratory distress.     Breath sounds: Normal breath sounds. No  wheezing, rhonchi or rales.  Skin:    General:  Skin is warm and dry.  Neurological:     Mental Status: She is alert.  Psychiatric:        Mood and Affect: Mood normal.        Thought Content: Thought content normal.      UC Treatments / Results  Labs (all labs ordered are listed, but only abnormal results are displayed) Labs Reviewed  SARS CORONAVIRUS 2 (TAT 6-24 HRS)  POCT RAPID STREP A (OFFICE)    EKG   Radiology No results found.  Procedures Procedures (including critical care time)  Medications Ordered in UC Medications - No data to display  Initial Impression / Assessment and Plan / UC Course  I have reviewed the triage vital signs and the nursing notes.  Pertinent labs & imaging results that were available during my care of the patient were reviewed by me and considered in my medical decision making (see chart for details).    Strep screening negative.  Suspect likely viral etiology of symptoms and will screen for COVID.  Recommended symptomatic treatment, increase fluids and rest with follow-up if symptoms do not improve or worsen in any way.  Final Clinical Impressions(s) / UC Diagnoses   Final diagnoses:  Acute upper respiratory infection   Discharge Instructions   None    ED Prescriptions   None    PDMP not reviewed this encounter.   Tomi Bamberger, PA-C 07/11/23 [id number]

## 2023-07-11 NOTE — ED Triage Notes (Signed)
"  I work up with sore throat, body aches, chills, headache, Fever at 99 something". "I just feel like garbage". No runny nose "sniffles maybe".  Voids "fine". Stools "normal". "I am really tired". No exposure to COVID19 or strep known. No new/unexplained rash.

## 2023-09-05 ENCOUNTER — Encounter: Payer: 59 | Admitting: Family

## 2023-09-05 NOTE — Progress Notes (Signed)
Erroneous encounter-disregard

## 2023-10-20 ENCOUNTER — Other Ambulatory Visit: Payer: Self-pay | Admitting: Obstetrics & Gynecology

## 2023-10-20 DIAGNOSIS — G8929 Other chronic pain: Secondary | ICD-10-CM

## 2024-01-09 ENCOUNTER — Ambulatory Visit
Admission: EM | Admit: 2024-01-09 | Discharge: 2024-01-09 | Disposition: A | Discharge status: Home / Self Care | Payer: No Typology Code available for payment source

## 2024-01-09 DIAGNOSIS — J069 Acute upper respiratory infection, unspecified: Secondary | ICD-10-CM | POA: Diagnosis not present

## 2024-01-09 LAB — POC COVID19/FLU A&B COMBO
Covid Antigen, POC: NEGATIVE
Influenza A Antigen, POC: NEGATIVE
Influenza B Antigen, POC: NEGATIVE

## 2024-01-09 NOTE — ED Triage Notes (Signed)
"  I think I had the Flu on Saturday (Fever, Fatigue, Body aches, Congestion) & now it is sitting in my chest and can't cough anything up". "The congestion in my chest makes it feel tight when trying to Cough". No left sided chest pain "no concerns with my heart". Last known Fever "Saturday up to 101".

## 2024-01-09 NOTE — ED Provider Notes (Signed)
[name]    CSN: [id number] Arrival date & time: 01/09/24  0815      History   Chief Complaint Chief Complaint  Patient presents with   Influenza   Fever    HPI Veronica Burgess is a 34 y.o. female.   Patient here today for evaluation of chest congestion, sore throat that started 4 days ago.  She reports she thought she initially had flu because she had fever, fatigue and bodyaches.  She has not had a fever over the last 3 days.  She denies any wheezing.  She has no history of asthma or bronchitis.  She has tried taking DayQuil without resolution.  The history is provided by the patient.  Chest Pain Associated symptoms: cough and headache   Associated symptoms: no abdominal pain, no fever, no nausea, no shortness of breath and no vomiting   Headache Associated symptoms: congestion, cough and sore throat   Associated symptoms: no abdominal pain, no diarrhea, no ear pain, no fever, no nausea and no vomiting     Past Medical History:  Diagnosis Date   Abdominal pain    ADHD (attention deficit hyperactivity disorder)    Combined   Anxiety    Depression    Migraines    UTI (urinary tract infection)     Patient Active Problem List   Diagnosis Date Noted   ASCUS with positive high risk HPV pap smear on 11/08/22 11/15/2022   Mirena IUD (intrauterine device) in place since 2017 11/08/2022   ADHD (attention deficit hyperactivity disorder) 11/08/2022   Fatigue 09/23/2020   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 05/12/2020   Chronic abdominal pain 02/03/2020   Migraines 02/03/2020   Bilateral hand pain 02/03/2020   GAD (generalized anxiety disorder) 02/13/2018   Bipolar disorder, currently in remission, most recent episode unspecified (HCC) 02/13/2018   Anxiety 02/13/2018    Past Surgical History:  Procedure Laterality Date   TONSILLECTOMY  2005   TONSILLECTOMY      OB History     Gravida  1   Para  1   Term  1   Preterm       AB      Living  1      SAB      IAB      Ectopic      Multiple      Live Births  1            Home Medications    Prior to Admission medications   Medication Sig Start Date End Date Taking? Authorizing Provider  Amphetamine ER (ADZENYS XR-ODT) 12.5 MG TBED Take by mouth.   Yes [provider]  buPROPion (WELLBUTRIN XL) 150 MG 24 hr tablet Take 150 mg by mouth daily. 11/07/19  Yes [provider]  buPROPion (WELLBUTRIN XL) 300 MG 24 hr tablet Take 300 mg by mouth daily. 11/07/19  Yes [provider]  cetirizine (ZYRTEC) 10 MG tablet Take 10 mg by mouth daily. 10/30/19  Yes [provider]  desvenlafaxine (PRISTIQ) 100 MG 24 hr tablet Take 100 mg by mouth daily. 03/11/21  Yes [provider]  drospirenone-ethinyl estradiol (YASMIN) 3-0.03 MG tablet TAKE 1 TABLET DAILY 10/24/23  Yes Anyanwu, Jethro Bastos, MD  fluticasone (FLONASE) 50 MCG/ACT nasal spray Place 1 spray into both nostrils daily. 11/07/19  Yes [provider]  levonorgestrel (MIRENA) 20 MCG/24HR IUD 1 each by Intrauterine route once. 02/01/17  Yes [provider]  levonorgestrel (MIRENA, 52 MG,) 20 MCG/DAY IUD 1 each by Intrauterine route once. 09/12/17  Yes [provider]  topiramate (TOPAMAX) 100 MG tablet Take 100 mg by mouth 2 (two) times daily. 11/07/19  Yes [provider]  acetaminophen (TYLENOL) 325 MG tablet Take 650 mg by mouth every 6 (six) hours as needed.    [provider]  Adapalene-Benzoyl Peroxide 0.3-2.5 % GEL Apply topically. Patient not taking: Reported on 06/26/2023 08/10/22   [provider]  ibuprofen (ADVIL) 200 MG tablet Take 600 mg by mouth every 6 (six) hours as needed.    [provider]  Multiple Vitamin (MULTIVITAMIN ADULT PO) Take by mouth.    [provider]  FLUoxetine (PROZAC) 20 MG capsule Take 1 capsule (20 mg total) by mouth daily. 08/19/20 09/21/20  Pucilowski,  Roosvelt Maser, MD    Family History Family History  Problem Relation Age of Onset   Depression Mother    Hyperlipidemia Mother    Thyroid disease Mother    Depression Father    Hypertension Father    ADD / ADHD Father    Depression Brother    ADD / ADHD Brother    Depression Maternal Grandmother    Diabetes Maternal Grandmother    Hyperlipidemia Maternal Grandmother    Hypertension Maternal Grandmother    Thyroid disease Maternal Grandmother    Alcohol abuse Maternal Grandfather    COPD Maternal Grandfather    Depression Maternal Grandfather    Depression Paternal Grandmother    Schizophrenia Paternal Grandmother    Drug abuse Paternal Grandmother    Hypertension Paternal Grandmother     Social History Social History   Tobacco Use   Smoking status: Never   Smokeless tobacco: Never  Vaping Use   Vaping status: Never Used  Substance Use Topics   Alcohol use: Yes    Comment: social    Drug use: Never     Allergies   Patient has no known allergies.   Review of Systems Review of Systems  Constitutional:  Negative for chills and fever.  HENT:  Positive for congestion and sore throat. Negative for ear pain.   Eyes:  Negative for discharge and redness.  Respiratory:  Positive for cough. Negative for shortness of breath and wheezing.   Gastrointestinal:  Negative for abdominal pain, diarrhea, nausea and vomiting.  Neurological:  Positive for headaches.     Physical Exam Triage Vital Signs ED Triage Vitals  Encounter Vitals Group     BP      Systolic BP Percentile      Diastolic BP Percentile      Pulse      Resp      Temp      Temp src      SpO2      Weight      Height      Head Circumference      Peak Flow      Pain Score      Pain Loc      Pain Education      Exclude from Growth Chart    No data found.  Updated Vital Signs BP 106/63 (BP Location: Left Arm)   Pulse 73   Temp 98.4 F (36.9 C) (Oral)   Resp 16   Ht 5\' 1"  (1.549 m)   Wt 145 lb  (65.8 kg)   LMP  (LMP Unknown)   SpO2 98%   BMI 27.40 kg/m  Visual Acuity Right Eye Distance:   Left Eye Distance:   Bilateral Distance:    Right Eye Near:   Left Eye Near:    Bilateral Near:     Physical Exam Vitals and nursing note reviewed.  Constitutional:      General: She is not in acute distress.    Appearance: Normal appearance. She is not ill-appearing.  HENT:     Head: Normocephalic and atraumatic.     Right Ear: Tympanic membrane and ear canal normal.     Left Ear: Tympanic membrane and ear canal normal.     Nose: Congestion present.     Mouth/Throat:     Mouth: Mucous membranes are moist.     Pharynx: No oropharyngeal exudate or posterior oropharyngeal erythema.  Eyes:     Conjunctiva/sclera: Conjunctivae normal.  Cardiovascular:     Rate and Rhythm: Normal rate and regular rhythm.     Heart sounds: Normal heart sounds. No murmur heard. Pulmonary:     Effort: Pulmonary effort is normal. No respiratory distress.     Breath sounds: Normal breath sounds. No wheezing, rhonchi or rales.  Skin:    General: Skin is warm and dry.  Neurological:     Mental Status: She is alert.  Psychiatric:        Mood and Affect: Mood normal.        Thought Content: Thought content normal.      UC Treatments / Results  Labs (all labs ordered are listed, but only abnormal results are displayed) Labs Reviewed  POC COVID19/FLU A&B COMBO - Normal    EKG   Radiology No results found.  Procedures Procedures (including critical care time)  Medications Ordered in UC Medications - No data to display  Initial Impression / Assessment and Plan / UC Course  I have reviewed the triage vital signs and the nursing notes.  Pertinent labs & imaging results that were available during my care of the patient were reviewed by me and considered in my medical decision making (see chart for details).     Suspect viral etiology of symptoms.  COVID and flu screening negative.   Recommended expectorant like Mucinex with increase fluids to thin mucus.  Advised follow-up if no gradual improvement or with any worsening symptoms.  Patient expresses understanding.   Final Clinical Impressions(s) / UC Diagnoses   Final diagnoses:  Viral upper respiratory tract infection   Discharge Instructions   None    ED Prescriptions   None    PDMP not reviewed this encounter.   Tomi Bamberger, PA-C 01/09/24 [phone]

## 2024-03-25 ENCOUNTER — Ambulatory Visit: Admitting: Obstetrics & Gynecology
